# Patient Record
Sex: Male | Born: 1990 | Race: Black or African American | Hispanic: No | Marital: Single | Smoking: Current some day smoker
Health system: Southern US, Community
[De-identification: ages and names within clinical notes are randomized; demographics above are authoritative.]

## PROBLEM LIST (undated history)

## (undated) DIAGNOSIS — D649 Anemia, unspecified: Secondary | ICD-10-CM

## (undated) DIAGNOSIS — Z87898 Personal history of other specified conditions: Secondary | ICD-10-CM

## (undated) DIAGNOSIS — L0291 Cutaneous abscess, unspecified: Secondary | ICD-10-CM

---
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
## 2009-04-12 ENCOUNTER — Ambulatory Visit: Payer: Self-pay | Admitting: Family Medicine

## 2009-04-12 DIAGNOSIS — R040 Epistaxis: Secondary | ICD-10-CM

## 2009-04-13 ENCOUNTER — Encounter: Payer: Self-pay | Admitting: Family Medicine

## 2011-08-21 LAB — POCT I-STAT, CHEM 8
BUN: 13
Calcium, Ion: 1.15
Creatinine, Ser: 1
Glucose, Bld: 97
Hemoglobin: 14.3
Sodium: 140
TCO2: 24

## 2011-11-13 ENCOUNTER — Emergency Department (HOSPITAL_COMMUNITY)
Admission: EM | Admit: 2011-11-13 | Discharge: 2011-11-14 | Disposition: A | Discharge status: Home / Self Care | Payer: Self-pay | Attending: Emergency Medicine | Admitting: Emergency Medicine

## 2011-11-13 ENCOUNTER — Emergency Department (HOSPITAL_COMMUNITY): Payer: Self-pay

## 2011-11-13 ENCOUNTER — Encounter: Payer: Self-pay | Admitting: *Deleted

## 2011-11-13 ENCOUNTER — Other Ambulatory Visit: Payer: Self-pay

## 2011-11-13 DIAGNOSIS — F10929 Alcohol use, unspecified with intoxication, unspecified: Secondary | ICD-10-CM

## 2011-11-13 DIAGNOSIS — R040 Epistaxis: Secondary | ICD-10-CM | POA: Insufficient documentation

## 2011-11-13 DIAGNOSIS — R55 Syncope and collapse: Secondary | ICD-10-CM | POA: Insufficient documentation

## 2011-11-13 DIAGNOSIS — F1010 Alcohol abuse, uncomplicated: Secondary | ICD-10-CM | POA: Insufficient documentation

## 2011-11-13 HISTORY — DX: Personal history of other specified conditions: Z87.898

## 2011-11-13 LAB — POCT I-STAT, CHEM 8
Calcium, Ion: 1.15 mmol/L (ref 1.12–1.32)
Creatinine, Ser: 1.3 mg/dL (ref 0.50–1.35)
Glucose, Bld: 95 mg/dL (ref 70–99)
HCT: 46 % (ref 39.0–52.0)
Hemoglobin: 15.6 g/dL (ref 13.0–17.0)
TCO2: 28 mmol/L (ref 0–100)

## 2011-11-13 MED ORDER — SODIUM CHLORIDE 0.9 % IV BOLUS (SEPSIS)
2000.0000 mL | Freq: Once | INTRAVENOUS | Status: AC
  Administered 2011-11-13: 2000 mL via INTRAVENOUS

## 2011-11-13 NOTE — ED Notes (Signed)
Pt states he began feeling dizzy while he was working at [employer] and stated he went to the bathroom and he is not sure if he passed out or not.  PERRL. No active bleeding at this time. No obvious deformities to the head or any other region of the body.   Pt is irritable, but non-combative.  Mother at bedside.

## 2011-11-13 NOTE — ED Notes (Signed)
EMS was called to pt's work (he works at [employer]).  Staff called b/c pt was in bathroom a long time.  They were able to get into the bathroom and found him with a nose bleed.  Bleeding stopped before EMS arrived.  EMS states pt was combative and did not give any information.  Mother arrived at the scene and was able to calm pt.  Mother states pt has been binge drinking for the past few weeks and states hx of anger issues.  BP 170/90, hr 110.  No meds given.

## 2011-11-13 NOTE — ED Notes (Signed)
EKG GIVEN TO DR Gwendolyn Grant. NO OLD EKG

## 2011-11-13 NOTE — ED Provider Notes (Signed)
History     CSN: [id number]  Arrival date & time 11/13/11  2211   First MD Initiated Contact with Patient 11/13/11 2212      Chief Complaint  Patient presents with  . Epistaxis  . Alcohol Intoxication    (Consider location/radiation/quality/duration/timing/severity/associated sxs/prior treatment) HPI Comments: Epistaxis recurrent, happened today while he was at work.  Went to bathroom, states he sat down with nosebleed, unable to give more information. Probable syncope in bathroom.  Drank alcohol earlier today.  Patient is a 20 y.o. male presenting with nosebleeds. The history is provided by the patient. No language interpreter was used.  Epistaxis  This is a recurrent problem. The current episode started 1 to 2 hours ago. The problem occurs every several days. The problem has been resolved. The problem is associated with an unknown factor. The bleeding has been from both nares. He has tried nothing for the symptoms. The treatment provided no relief. His past medical history is significant for frequent nosebleeds.    Past Medical History  Diagnosis Date  . History of epistaxis     years ago    No past surgical history on file.  No family history on file.  History  Substance Use Topics  . Smoking status: Not on file  . Smokeless tobacco: Not on file  . Alcohol Use:       Review of Systems  Constitutional: Negative for fever and chills.  HENT: Positive for nosebleeds.   Cardiovascular: Negative for leg swelling.  Gastrointestinal: Negative for nausea and vomiting.  All other systems reviewed and are negative.    Allergies  Review of patient's allergies indicates no known allergies.  Home Medications  No current outpatient prescriptions on file.  BP 129/80  Pulse 86  Resp 15  SpO2 100%  Physical Exam  Constitutional: He is oriented to person, place, and time. He appears well-developed and well-nourished. No distress.  HENT:  Head: Normocephalic and  atraumatic.  Mouth/Throat: No oropharyngeal exudate.  Eyes: EOM are normal. Pupils are equal, round, and reactive to light.  Neck: Normal range of motion. Neck supple.  Cardiovascular: Normal rate and regular rhythm.  Exam reveals no friction rub.   No murmur heard. Pulmonary/Chest: Effort normal and breath sounds normal. No respiratory distress. He has no wheezes. He has no rales.  Abdominal: He exhibits no distension. There is no tenderness. There is no rebound.  Musculoskeletal: Normal range of motion. He exhibits no edema.  Neurological: He is alert and oriented to person, place, and time.       Heavily intoxicated  Skin: He is not diaphoretic.    ED Course  Procedures (including critical care time)   Labs Reviewed  I-STAT, CHEM 8   No results found.   1. EPISTAXIS, RECURRENT   2. Syncope   3. Alcohol intoxication       Date: 11/13/2011  Rate: 80  Rhythm: normal sinus rhythm  QRS Axis: normal  Intervals: normal  ST/T Wave abnormalities: early repolarization  Conduction Disutrbances:none  Narrative Interpretation:  R-S-R' in V1/V2  Old EKG Reviewed: none available   I-STAT, chem 8 (Final result)  Abnormal  Component (Lab Inquiry)      Result Time  NA  K  CL  BUN  Creatinine, Ser    11/13/11 2308  142  3.8  102  3 (L)  1.30           Result Time  GLUCOSE  CA ION  TCO2  HGB  HCT    11/13/11 2308  95  1.15  28  15.6  46.0          Imaging Results         CT Head Wo Contrast (Final result)   Result time:11/13/11 2319    Final result by Rad Results In Interface (11/13/11 23:19:03)    Narrative:   *RADIOLOGY REPORT*  Clinical Data: Epistaxis. Altered mental status.  CT HEAD WITHOUT CONTRAST  Technique: Contiguous axial images were obtained from the base of the skull through the vertex without contrast.  Comparison: None.  Findings: There is no evidence of acute infarction, mass lesion, or intra- or extra-axial hemorrhage on CT.  The posterior  fossa, including the cerebellum, brainstem and fourth ventricle, is within normal limits. The third and lateral ventricles, and basal ganglia are unremarkable in appearance. The cerebral hemispheres are symmetric in appearance, with normal gray- white differentiation. No mass effect or midline shift is seen.  There is no evidence of fracture; visualized osseous structures are unremarkable in appearance. The visualized portions of the orbits are within normal limits. The paranasal sinuses and mastoid air cells are well-aerated. No significant soft tissue abnormalities are seen.  IMPRESSION: Unremarkable noncontrast CT of the head.  Original Report Authenticated By: Tonia Ghent, M.D.    MDM  76M intoxicated, presenting with nosebleed. Found on flor of bathroom at [employer] where he works. Hx of recurrent nosebleeds. Intoxicated on arrival, unable to tell us full details about what happened. States he sat down in the bathroom. States he drank earlier in the day, obvious intoxication in ED.  Epistaxis resolved, blood in bilateral nares, no septal hematoma in nose. No tenderness/deformities of face. No extremity deformities. Will give fluids, check head CT since probable syncope. Will check baseline labs for glucose and Hgb values. Head CT, reviewed by me, read as negative. Normal blood sugar, hemoglobin. EKG as above. Stable for discharge, instruct to f/u with PCP in 3-4 days.         Elwin Mocha, MD 11/13/11 [phone]

## 2011-11-13 NOTE — ED Notes (Signed)
2000 ml bolus infusing.  Pt states he feels better and is able to answer all loc questions.  Vs stable.  No epistaxis noted.

## 2011-11-14 NOTE — ED Provider Notes (Signed)
The patient currently has clear speech and is oriented to person and place as well as time with pupils reactive extraocular movements intact peripheral visual fields full to confrontation no facial asymmetry normal light touch bilaterally out of 5 strength without pronator drift bilaterally normal finger to nose testing bilaterally, the patient has a ride home.  I saw and evaluated the patient, reviewed the resident's note and I agree with the findings and plan  including the ECG interpretation.   Hurman Horn, MD 11/14/11 0200

## 2012-10-18 ENCOUNTER — Emergency Department (HOSPITAL_COMMUNITY)
Admission: EM | Admit: 2012-10-18 | Discharge: 2012-10-18 | Disposition: A | Discharge status: Home / Self Care | Payer: Self-pay | Attending: Emergency Medicine | Admitting: Emergency Medicine

## 2012-10-18 ENCOUNTER — Encounter (HOSPITAL_COMMUNITY): Payer: Self-pay | Admitting: Emergency Medicine

## 2012-10-18 DIAGNOSIS — K529 Noninfective gastroenteritis and colitis, unspecified: Secondary | ICD-10-CM

## 2012-10-18 DIAGNOSIS — K520 Gastroenteritis and colitis due to radiation: Secondary | ICD-10-CM | POA: Insufficient documentation

## 2012-10-18 DIAGNOSIS — R197 Diarrhea, unspecified: Secondary | ICD-10-CM | POA: Insufficient documentation

## 2012-10-18 LAB — CBC WITH DIFFERENTIAL/PLATELET
Basophils Absolute: 0 10*3/uL (ref 0.0–0.1)
Basophils Relative: 0 % (ref 0–1)
Eosinophils Absolute: 0.1 10*3/uL (ref 0.0–0.7)
Hemoglobin: 12.6 g/dL — ABNORMAL LOW (ref 13.0–17.0)
MCH: 24.7 pg — ABNORMAL LOW (ref 26.0–34.0)
MCHC: 32.6 g/dL (ref 30.0–36.0)
Monocytes Relative: 10 % (ref 3–12)
Neutro Abs: 8.3 10*3/uL — ABNORMAL HIGH (ref 1.7–7.7)
Neutrophils Relative %: 79 % — ABNORMAL HIGH (ref 43–77)
Platelets: 363 10*3/uL (ref 150–400)
RDW: 13.8 % (ref 11.5–15.5)

## 2012-10-18 LAB — BASIC METABOLIC PANEL
Calcium: 9.2 mg/dL (ref 8.4–10.5)
Creatinine, Ser: 0.94 mg/dL (ref 0.50–1.35)
Potassium: 3.4 mEq/L — ABNORMAL LOW (ref 3.5–5.1)

## 2012-10-18 MED ORDER — ONDANSETRON HCL 4 MG/2ML IJ SOLN
4.0000 mg | Freq: Once | INTRAMUSCULAR | Status: AC
  Administered 2012-10-18: 4 mg via INTRAVENOUS
  Filled 2012-10-18: qty 2

## 2012-10-18 MED ORDER — SODIUM CHLORIDE 0.9 % IV BOLUS (SEPSIS)
2000.0000 mL | Freq: Once | INTRAVENOUS | Status: AC
  Administered 2012-10-18: 2000 mL via INTRAVENOUS

## 2012-10-18 MED ORDER — ONDANSETRON HCL 8 MG PO TABS: 8.0000 mg | ORAL_TABLET | ORAL | Status: DC | PRN

## 2012-10-18 NOTE — ED Notes (Signed)
NAD noted at time of d/c home with friend 

## 2012-10-18 NOTE — ED Notes (Signed)
Pt c/o N/V/D x 2 days with generalized body aches

## 2012-10-18 NOTE — ED Provider Notes (Signed)
History     CSN: [id number]  Arrival date & time 10/18/12  1322   First MD Initiated Contact with Patient 10/18/12 1409      Chief Complaint  Patient presents with  . Emesis  . Diarrhea    (Consider location/radiation/quality/duration/timing/severity/associated sxs/prior treatment) HPI The patient presents to the ED with a 1 day history of vomiting and diarrhea.  He reports nausea and vomiting since 7PM last night.  He reports 20 episode of vomiting since yesterday and 10 episodes of diarrhea last night.   He has not had diarrhea today and reports last emesis was 2 hours ago.  Denies fever reports chills.  Denies blood in stool.  His significant other reports similar symptoms earlier this week. The patient states that he ate cake and drank gatorade prior to arrival.   Past Medical History  Diagnosis Date  . History of epistaxis     years ago    History reviewed. No pertinent past surgical history.  History reviewed. No pertinent family history.  History  Substance Use Topics  . Smoking status: Never Smoker   . Smokeless tobacco: Not on file  . Alcohol Use: Yes     Comment: occ      Review of Systems All other systems negative except as documented in the HPI. All pertinent positives and negatives as reviewed in the HPI.   Allergies  Review of patient's allergies indicates no known allergies.  Home Medications  No current outpatient prescriptions on file.  BP 130/72  Pulse 83  Temp 98 F (36.7 C) (Oral)  Resp 18  SpO2 97%  Physical Exam  Nursing note and vitals reviewed. Constitutional: He appears well-developed and well-nourished.  HENT:  Mouth/Throat: Oropharynx is clear and moist and mucous membranes are normal.  Neck: Neck supple.  Cardiovascular: Normal rate, regular rhythm, S1 normal, S2 normal and normal heart sounds.   No murmur heard.  No systolic murmur is present   No diastolic murmur is present  Pulmonary/Chest: Effort normal and breath  sounds normal. He has no decreased breath sounds. He has no wheezes. He has no rhonchi. He has no rales.  Abdominal: Soft. Normal appearance. There is generalized tenderness. There is no rebound and no guarding.  Neurological: He is alert.    ED Course  Procedures (including critical care time)  Labs Reviewed  CBC WITH DIFFERENTIAL - Abnormal; Notable for the following:    Hemoglobin 12.6 (*)     HCT 38.7 (*)     MCV 75.9 (*)     MCH 24.7 (*)     Neutrophils Relative 79 (*)     Neutro Abs 8.3 (*)     Lymphocytes Relative 10 (*)     Monocytes Absolute 1.1 (*)     All other components within normal limits  BASIC METABOLIC PANEL - Abnormal; Notable for the following:    Sodium 134 (*)     Potassium 3.4 (*)     All other components within normal limits  URINALYSIS, ROUTINE W REFLEX MICROSCOPIC   The patient is stable here thus far. Will give IV fluids and monitor.    MDM          Carlyle Dolly, PA-C 10/18/12 [phone]

## 2012-10-19 NOTE — ED Provider Notes (Signed)
Medical screening examination/treatment/procedure(s) were performed by non-physician practitioner and as supervising physician I was immediately available for consultation/collaboration.  Alizza Sacra, MD 10/19/12 0654 

## 2013-02-12 ENCOUNTER — Other Ambulatory Visit: Payer: Self-pay

## 2013-02-12 ENCOUNTER — Encounter (HOSPITAL_COMMUNITY): Payer: Self-pay | Admitting: Family Medicine

## 2013-02-12 ENCOUNTER — Emergency Department (HOSPITAL_COMMUNITY)
Admission: EM | Admit: 2013-02-12 | Discharge: 2013-02-12 | Disposition: A | Discharge status: Home / Self Care | Payer: Self-pay | Attending: Emergency Medicine | Admitting: Emergency Medicine

## 2013-02-12 ENCOUNTER — Emergency Department (HOSPITAL_COMMUNITY): Payer: Self-pay

## 2013-02-12 DIAGNOSIS — R404 Transient alteration of awareness: Secondary | ICD-10-CM | POA: Insufficient documentation

## 2013-02-12 DIAGNOSIS — Z8669 Personal history of other diseases of the nervous system and sense organs: Secondary | ICD-10-CM | POA: Insufficient documentation

## 2013-02-12 DIAGNOSIS — R51 Headache: Secondary | ICD-10-CM | POA: Insufficient documentation

## 2013-02-12 DIAGNOSIS — R259 Unspecified abnormal involuntary movements: Secondary | ICD-10-CM | POA: Insufficient documentation

## 2013-02-12 DIAGNOSIS — R55 Syncope and collapse: Secondary | ICD-10-CM | POA: Insufficient documentation

## 2013-02-12 DIAGNOSIS — F29 Unspecified psychosis not due to a substance or known physiological condition: Secondary | ICD-10-CM | POA: Insufficient documentation

## 2013-02-12 LAB — CBC WITH DIFFERENTIAL/PLATELET
Basophils Relative: 0 % (ref 0–1)
Eosinophils Relative: 0 % (ref 0–5)
HCT: 41.2 % (ref 39.0–52.0)
Lymphs Abs: 1 10*3/uL (ref 0.7–4.0)
MCH: 24.9 pg — ABNORMAL LOW (ref 26.0–34.0)
MCV: 72.2 fL — ABNORMAL LOW (ref 78.0–100.0)
Monocytes Absolute: 0.8 10*3/uL (ref 0.1–1.0)
Neutro Abs: 11.9 10*3/uL — ABNORMAL HIGH (ref 1.7–7.7)
Platelets: 404 10*3/uL — ABNORMAL HIGH (ref 150–400)
RBC: 5.71 MIL/uL (ref 4.22–5.81)

## 2013-02-12 LAB — COMPREHENSIVE METABOLIC PANEL
BUN: 14 mg/dL (ref 6–23)
CO2: 24 mEq/L (ref 19–32)
Calcium: 10.1 mg/dL (ref 8.4–10.5)
Chloride: 94 mEq/L — ABNORMAL LOW (ref 96–112)
Creatinine, Ser: 0.9 mg/dL (ref 0.50–1.35)
GFR calc Af Amer: >90 mL/min (ref >90)
GFR calc non Af Amer: >90 mL/min (ref >90)
Glucose, Bld: 96 mg/dL (ref 70–99)
Total Bilirubin: 0.9 mg/dL (ref 0.3–1.2)

## 2013-02-12 LAB — RAPID URINE DRUG SCREEN, HOSP PERFORMED
Benzodiazepines: NOT DETECTED
Cocaine: NOT DETECTED
Opiates: NOT DETECTED

## 2013-02-12 MED ORDER — ACETAMINOPHEN 500 MG PO TABS
1000.0000 mg | ORAL_TABLET | Freq: Once | ORAL | Status: AC
  Administered 2013-02-12: 1000 mg via ORAL
  Filled 2013-02-12: qty 2

## 2013-02-12 MED ORDER — SODIUM CHLORIDE 0.9 % IV BOLUS (SEPSIS)
1000.0000 mL | Freq: Once | INTRAVENOUS | Status: AC
  Administered 2013-02-12: 1000 mL via INTRAVENOUS

## 2013-02-12 NOTE — ED Provider Notes (Signed)
History     CSN: [id number]  Arrival date & time 02/12/13  0932   First MD Initiated Contact with Patient 02/12/13 [phone]      Chief Complaint  Patient presents with  . Seizures    (Consider location/radiation/quality/duration/timing/severity/associated sxs/prior treatment) HPI Comments: Patient is a 22 year old male with no past medical history who presents after a witnessed seizure prior to arrival. Patient reports drinking milk this morning when he lost consciousness and started shaking. The patient's girlfriend was the witnessed who thinks he had a seizure. Patient has no known seizure disorder. The episode lasted "a few seconds" before spontaneously resolving. Patient does not remember anything and reports waking up in the ambulance. He denies any drug use. He reports some confusion after the episode and reports a current headache. He is unsure of any head trauma. No aggravating/alleviating factors. No other associated symptoms.    Past Medical History  Diagnosis Date  . History of epistaxis     years ago    History reviewed. No pertinent past surgical history.  History reviewed. No pertinent family history.  History  Substance Use Topics  . Smoking status: Never Smoker   . Smokeless tobacco: Not on file  . Alcohol Use: Yes     Comment: occ      Review of Systems  Neurological: Positive for seizures.  All other systems reviewed and are negative.    Allergies  Review of patient's allergies indicates no known allergies.  Home Medications  No current outpatient prescriptions on file.  BP 120/82  Pulse 112  Temp(Src) 99.4 F (37.4 C) (Oral)  Resp 14  SpO2 100%  Physical Exam  Nursing note and vitals reviewed. Constitutional: He is oriented to person, place, and time. He appears well-developed and well-nourished. No distress.  HENT:  Head: Normocephalic and atraumatic.  Mouth/Throat: Oropharynx is clear and moist. No oropharyngeal exudate.  Eyes:  Conjunctivae are normal. Pupils are equal, round, and reactive to light. No scleral icterus.  Neck: Normal range of motion. Neck supple.  Cardiovascular: Normal rate and regular rhythm.  Exam reveals no gallop and no friction rub.   No murmur heard. Pulmonary/Chest: Effort normal and breath sounds normal. He has no wheezes. He has no rales. He exhibits no tenderness.  Abdominal: Soft. He exhibits no distension. There is no tenderness. There is no rebound.  Musculoskeletal: Normal range of motion.  Neurological: He is alert and oriented to person, place, and time. No cranial nerve deficit. Coordination normal.  Strength and sensation equal and intact bilaterally. Speech is goal-oriented. Moves limbs without ataxia.   Skin: Skin is warm and dry.  Psychiatric: He has a normal mood and affect. His behavior is normal.    ED Course  Procedures (including critical care time)  Labs Reviewed  CBC WITH DIFFERENTIAL - Abnormal; Notable for the following:    WBC 13.7 (*)    MCV 72.2 (*)    MCH 24.9 (*)    Platelets 404 (*)    Neutrophils Relative 87 (*)    Lymphocytes Relative 7 (*)    Neutro Abs 11.9 (*)    All other components within normal limits  COMPREHENSIVE METABOLIC PANEL - Abnormal; Notable for the following:    Potassium 6.2 (*)    Chloride 94 (*)    Total Protein 8.8 (*)    AST 121 (*)    All other components within normal limits  URINE RAPID DRUG SCREEN (HOSP PERFORMED)  POTASSIUM   Ct Head  Wo Contrast  02/12/2013  *RADIOLOGY REPORT*  Clinical Data:  Seizure  CT HEAD WITHOUT CONTRAST  Technique:  Contiguous axial images were obtained from the base of the skull through the vertex without contrast  Comparison:  11/13/2011  Findings:  The brain has a normal appearance without evidence for hemorrhage, acute infarction, hydrocephalus, or mass lesion.  There is no extra axial fluid collection.  The skull and paranasal sinuses are normal.  IMPRESSION: Normal CT of the head without  contrast.   Original Report Authenticated By: Janeece Riggers, M.D.      1. Syncope       MDM  10:03 AM Labs and CT head pending. Patient will have tylenol for headache.   1:39 PM Initial potassium level was 6.2 and recheck shows 4.5. Patient feels better. Head CT unremarkable. UDS unremarkable. Patient will be discharged with recommended neurology follow up . Patient instructed to return with worsening or concerning symptoms. Vitals stable and patient afebrile. No DVT risk factors.      Emilia Beck, PA-C 02/12/13 1343

## 2013-02-12 NOTE — ED Notes (Signed)
Per EMS, pt friends witnessed a shaking episode and some confusion this am. Unknown if seizure. No hx of same. CBG 82. Tachy on the monitor 118. BP 122 sys. 18 LFA. A&Ox3. sts confused about what happened. sts feels febrile.

## 2013-02-12 NOTE — ED Notes (Signed)
Patient transported to CT 

## 2013-02-12 NOTE — ED Provider Notes (Signed)
Medical screening examination/treatment/procedure(s) were performed by non-physician practitioner and as supervising physician I was immediately available for consultation/collaboration.   Carleene Cooper III, MD 02/12/13 2139

## 2013-02-12 NOTE — ED Provider Notes (Signed)
12:10 PM  Date: 02/12/2013  Rate: 120  Rhythm: sinus tachycardia  QRS Axis: right  Intervals: normal' PQRS:  Left atrial abnormality, left ventricular hypertrophy  ST/T Wave abnormalities: early repolarization  Conduction Disutrbances:none  Narrative Interpretation: Abnormal EKG  Old EKG Reviewed: changes noted--Rate more rapid tan on tracing of 11/13/2011.    Carleene Cooper III, MD 02/12/13 [phone]

## 2013-02-12 NOTE — ED Notes (Signed)
Pt return from CT.

## 2013-02-12 NOTE — ED Notes (Signed)
Diet ordered for pt

## 2013-10-03 ENCOUNTER — Encounter (HOSPITAL_COMMUNITY): Payer: Self-pay | Admitting: Emergency Medicine

## 2013-10-03 ENCOUNTER — Emergency Department (HOSPITAL_COMMUNITY): Payer: Self-pay

## 2013-10-03 ENCOUNTER — Emergency Department (HOSPITAL_COMMUNITY)
Admission: EM | Admit: 2013-10-03 | Discharge: 2013-10-03 | Disposition: A | Discharge status: Home / Self Care | Payer: Self-pay | Attending: Emergency Medicine | Admitting: Emergency Medicine

## 2013-10-03 ENCOUNTER — Emergency Department (INDEPENDENT_AMBULATORY_CARE_PROVIDER_SITE_OTHER)
Admission: EM | Admit: 2013-10-03 | Discharge: 2013-10-03 | Disposition: A | Discharge status: Other Acute Inpatient Hospital | Payer: Self-pay | Source: Home / Self Care | Attending: Family Medicine | Admitting: Family Medicine

## 2013-10-03 DIAGNOSIS — F1010 Alcohol abuse, uncomplicated: Secondary | ICD-10-CM | POA: Insufficient documentation

## 2013-10-03 DIAGNOSIS — F10929 Alcohol use, unspecified with intoxication, unspecified: Secondary | ICD-10-CM

## 2013-10-03 DIAGNOSIS — R5381 Other malaise: Secondary | ICD-10-CM | POA: Insufficient documentation

## 2013-10-03 DIAGNOSIS — R4182 Altered mental status, unspecified: Secondary | ICD-10-CM

## 2013-10-03 DIAGNOSIS — Y929 Unspecified place or not applicable: Secondary | ICD-10-CM | POA: Insufficient documentation

## 2013-10-03 DIAGNOSIS — X58XXXA Exposure to other specified factors, initial encounter: Secondary | ICD-10-CM | POA: Insufficient documentation

## 2013-10-03 DIAGNOSIS — Z8709 Personal history of other diseases of the respiratory system: Secondary | ICD-10-CM | POA: Insufficient documentation

## 2013-10-03 DIAGNOSIS — Y939 Activity, unspecified: Secondary | ICD-10-CM | POA: Insufficient documentation

## 2013-10-03 DIAGNOSIS — IMO0002 Reserved for concepts with insufficient information to code with codable children: Secondary | ICD-10-CM | POA: Insufficient documentation

## 2013-10-03 DIAGNOSIS — R4789 Other speech disturbances: Secondary | ICD-10-CM | POA: Insufficient documentation

## 2013-10-03 LAB — COMPREHENSIVE METABOLIC PANEL
AST: 68 U/L — ABNORMAL HIGH (ref 0–37)
Alkaline Phosphatase: 52 U/L (ref 39–117)
BUN: 6 mg/dL (ref 6–23)
Chloride: 105 mEq/L (ref 96–112)
Creatinine, Ser: 0.81 mg/dL (ref 0.50–1.35)
GFR calc Af Amer: >90 mL/min (ref >90)
Glucose, Bld: 99 mg/dL (ref 70–99)
Potassium: 3.9 mEq/L (ref 3.5–5.1)
Total Bilirubin: 0.3 mg/dL (ref 0.3–1.2)
Total Protein: 6.6 g/dL (ref 6.0–8.3)

## 2013-10-03 LAB — CBC WITH DIFFERENTIAL/PLATELET
Basophils Absolute: 0 10*3/uL (ref 0.0–0.1)
Eosinophils Absolute: 0 10*3/uL (ref 0.0–0.7)
Eosinophils Relative: 0 % (ref 0–5)
HCT: 35.5 % — ABNORMAL LOW (ref 39.0–52.0)
Hemoglobin: 11.9 g/dL — ABNORMAL LOW (ref 13.0–17.0)
Lymphs Abs: 1.5 10*3/uL (ref 0.7–4.0)
MCH: 25.3 pg — ABNORMAL LOW (ref 26.0–34.0)
MCV: 75.5 fL — ABNORMAL LOW (ref 78.0–100.0)
Monocytes Absolute: 0.9 10*3/uL (ref 0.1–1.0)
Monocytes Relative: 7 % (ref 3–12)
Neutrophils Relative %: 83 % — ABNORMAL HIGH (ref 43–77)
Platelets: 328 10*3/uL (ref 150–400)
RBC: 4.7 MIL/uL (ref 4.22–5.81)

## 2013-10-03 LAB — ETHANOL: Alcohol, Ethyl (B): 411 mg/dL (ref 0–11)

## 2013-10-03 LAB — RAPID URINE DRUG SCREEN, HOSP PERFORMED
Barbiturates: NOT DETECTED
Cocaine: NOT DETECTED

## 2013-10-03 MED ORDER — SODIUM CHLORIDE 0.9 % IV BOLUS (SEPSIS)
1000.0000 mL | Freq: Once | INTRAVENOUS | Status: AC
  Administered 2013-10-03: 1000 mL via INTRAVENOUS

## 2013-10-03 MED ORDER — NALOXONE HCL 0.4 MG/ML IJ SOLN
INTRAMUSCULAR | Status: AC
  Filled 2013-10-03: qty 1

## 2013-10-03 MED ORDER — NALOXONE HCL 0.4 MG/ML IJ SOLN
0.4000 mg | INTRAMUSCULAR | Status: DC | PRN
  Administered 2013-10-03 (×2): 0.4 mg via INTRAVENOUS

## 2013-10-03 MED ORDER — NALOXONE HCL 0.4 MG/ML IJ SOLN: 0.4000 mg | INTRAMUSCULAR | Status: DC | PRN

## 2013-10-03 NOTE — ED Notes (Signed)
Dr. Plunkett at bedside.  

## 2013-10-03 NOTE — ED Notes (Signed)
[facility] called. 

## 2013-10-03 NOTE — ED Provider Notes (Addendum)
CSN: [id number]     Arrival date & time 10/03/13  1058 History   First MD Initiated Contact with Patient 10/03/13 1107     Chief Complaint  Patient presents with  . Altered Mental Status   (Consider location/radiation/quality/duration/timing/severity/associated sxs/prior Treatment) Patient is a 22 y.o. male presenting with altered mental status. The history is provided by the patient.  Altered Mental Status Presenting symptoms: partial responsiveness   Presenting symptoms: no confusion   Severity:  Moderate Most recent episode:  Today Episode history:  Continuous Duration:  2 hours Timing:  Constant Progression:  Unchanged Chronicity:  New Context: alcohol use   Context: not drug use, not head injury, not a recent change in medication, not a recent illness and not a recent infection   Associated symptoms: slurred speech   Associated symptoms: no abdominal pain, no bladder incontinence, no decreased appetite, no depression, no difficulty breathing, no fever, no headaches, no light-headedness, no vomiting and no weakness     Past Medical History  Diagnosis Date  . History of epistaxis     years ago   History reviewed. No pertinent past surgical history. History reviewed. No pertinent family history. History  Substance Use Topics  . Smoking status: Never Smoker   . Smokeless tobacco: Never Used  . Alcohol Use: 1.8 oz/week    3 Cans of beer per week     Comment: ETOH on board today    Review of Systems  Constitutional: Negative for fever and decreased appetite.  Gastrointestinal: Negative for vomiting and abdominal pain.  Genitourinary: Negative for bladder incontinence.  Neurological: Negative for weakness, light-headedness and headaches.  Psychiatric/Behavioral: Negative for confusion.  All other systems reviewed and are negative.    Allergies  Review of patient's allergies indicates no known allergies.  Home Medications  No current outpatient prescriptions on  file. BP 128/75  Pulse 71  Temp(Src) 97.9 F (36.6 C) (Oral)  Resp 22  SpO2 99% Physical Exam  Nursing note and vitals reviewed. Constitutional: He is oriented to person, place, and time. He appears well-developed and well-nourished. He is sleeping. He is easily aroused. No distress.  Will wake up easily to voice and answer questions appropriately  HENT:  Head: Normocephalic and atraumatic.  Mouth/Throat: Oropharynx is clear and moist.  Eyes: Conjunctivae and EOM are normal. Pupils are equal, round, and reactive to light.  Neck: Normal range of motion. Neck supple.  Cardiovascular: Normal rate, regular rhythm and intact distal pulses.   No murmur heard. Pulmonary/Chest: Effort normal and breath sounds normal. No respiratory distress. He has no wheezes. He has no rales.  Abdominal: Soft. He exhibits no distension. There is no tenderness. There is no rebound and no guarding.  Musculoskeletal: Normal range of motion. He exhibits no edema and no tenderness.  Small abrasion to the right knee  Neurological: He is oriented to person, place, and time and easily aroused.  Skin: Skin is warm and dry. No rash noted. No erythema.  Psychiatric: He has a normal mood and affect. His behavior is normal.    ED Course  Procedures (including critical care time) Labs Review Labs Reviewed  CBC WITH DIFFERENTIAL - Abnormal; Notable for the following:    WBC 14.4 (*)    Hemoglobin 11.9 (*)    HCT 35.5 (*)    MCV 75.5 (*)    MCH 25.3 (*)    Neutrophils Relative % 83 (*)    Neutro Abs 11.9 (*)    Lymphocytes Relative 11 (*)  All other components within normal limits  COMPREHENSIVE METABOLIC PANEL - Abnormal; Notable for the following:    Calcium 7.9 (*)    Albumin 3.3 (*)    AST 68 (*)    ALT 59 (*)    All other components within normal limits  ETHANOL - Abnormal; Notable for the following:    Alcohol, Ethyl (B) 411 (*)    All other components within normal limits  URINE RAPID DRUG SCREEN  (HOSP PERFORMED)   Imaging Review Ct Head Wo Contrast  10/03/2013   CLINICAL DATA:  Lethargy.  EXAM: CT HEAD WITHOUT CONTRAST  TECHNIQUE: Contiguous axial images were obtained from the base of the skull through the vertex without intravenous contrast.  COMPARISON:  Head CT scan 02/12/2013.  FINDINGS: The brain appears normal without infarct, hemorrhage, mass lesion, mass effect, midline shift or abnormal extra-axial fluid collection. There is no hydrocephalus or tonsillar. The calvarium is intact. Imaged paranasal sinuses and mastoid air cells are clear.  IMPRESSION: Negative head CT.   Electronically Signed   By: Drusilla Kanner M.D.   On: 10/03/2013 13:06    EKG Interpretation     Ventricular Rate:  72 PR Interval:  124 QRS Duration: 96 QT Interval:  398 QTC Calculation: 435 R Axis:   92 Text Interpretation:  Sinus rhythm Consider right ventricular hypertrophy ST elev, probable normal early repol pattern No significant change since last tracing            MDM   1. Alcohol intoxication     Patient presented from urgent care due to altered mental status for the last 2 hours. Patient is a rare but drowsy. Family member with him states that he drinks 3 beers this morning but had no other substances that he is aware of. Patient denies drug use. Patient can answer questions appropriately and states he just wants to sleep and he will otherwise be fine. He has an unremarkable EKG and no exam findings except for drowsiness and occasional rhythmic of the eyes.  Is a reactive bilaterally. At urgent care patient was given Narcan with no response. Family member denies any head injury and patient is neurovascularly intact. Feel most likely this is substance related. CBC, CMP, alcohol, UDS, head CT pending  3:58 PM Pt is now sober and will be d/ced with family.  Gwyneth Sprout, MD 10/03/13 1559  Gwyneth Sprout, MD 10/03/13 1610

## 2013-10-03 NOTE — ED Notes (Addendum)
Pt transferred from [facility] by [facility]. Pt was found to be lethargic by his Grandfather approx 1 hour ago after drinking 3 beers with his grandfather. When asked without grandfather at bedside, pt denies any illicit drug use.  Pt was given Narcan by [facility], per [facility], this made the pt slightly more alert. PT is oriented x4, arousable to speech. Pt received NS PTA.

## 2013-10-03 NOTE — ED Provider Notes (Addendum)
CSN: [id number]     Arrival date & time 10/03/13  1011 History   First MD Initiated Contact with Patient 10/03/13 1027     No chief complaint on file.  (Consider location/radiation/quality/duration/timing/severity/associated sxs/prior Treatment) Patient is a 22 y.o. male presenting with neurologic complaint. The history is provided by the patient.  Neurologic Problem This is a new problem. The current episode started 1 to 2 hours ago (according to grandfather suddenly "don't feel well", no other complaints.). The problem occurs constantly. The problem has been gradually worsening. Pertinent negatives include no chest pain, no abdominal pain and no headaches.    Past Medical History  Diagnosis Date  . History of epistaxis     years ago   History reviewed. No pertinent past surgical history. History reviewed. No pertinent family history. History  Substance Use Topics  . Smoking status: Never Smoker   . Smokeless tobacco: Not on file  . Alcohol Use: Yes     Comment: occ    Review of Systems  Constitutional: Negative.   HENT: Negative.   Respiratory: Negative.   Cardiovascular: Negative for chest pain.  Gastrointestinal: Negative.  Negative for abdominal pain.  Neurological: Negative for headaches.    Allergies  Review of patient's allergies indicates no known allergies.  Home Medications  No current outpatient prescriptions on file. BP 138/87  Pulse 86  Temp(Src) 98.2 F (36.8 C) (Oral)  Resp 12  SpO2 98% Physical Exam  Nursing note and vitals reviewed. Constitutional: He appears well-developed and well-nourished. He appears lethargic. He appears distressed.  HENT:  Head: Normocephalic and atraumatic.  Eyes: Pupils are equal, round, and reactive to light.  Disconjugate eye movements,not prev known by family.  Neck: Normal range of motion. Neck supple.  Cardiovascular: Normal rate, regular rhythm, normal heart sounds and intact distal pulses.   Pulmonary/Chest:  Effort normal and breath sounds normal.  Abdominal: Soft. Bowel sounds are normal.  Lymphadenopathy:    He has no cervical adenopathy.  Neurological: He has normal strength. He appears lethargic. He is disoriented.  Nl gross motor fxn of ext.  Skin: Skin is warm and dry.    ED Course  Procedures (including critical care time) Labs Review Labs Reviewed  GLUCOSE, CAPILLARY   Imaging Review No results found.  EKG Interpretation     Ventricular Rate:    PR Interval:    QRS Duration:   QT Interval:    QTC Calculation:   R Axis:     Text Interpretation:              MDM   1. Altered mental status   sent for further eval of acute change in mental state, had 3 beers this am, denies drug use, no pain ,no fever. [id number] , 1amp narcan given x 2. Possible improved alertness.   Linna Hoff, MD 10/03/13 1034  Linna Hoff, MD 10/03/13 1036  Linna Hoff, MD 10/03/13 1040  Linna Hoff, MD 10/03/13 [phone]

## 2013-10-03 NOTE — ED Notes (Signed)
Pt comfortable with d/c and f/u instructions. Ambulatory in room and out to waiting room with family members. A&Ox4, in NAD, requesting to leave as soon as possible. No prescriptions.

## 2013-10-03 NOTE — ED Notes (Signed)
Grandfather brought in , aint making no sense; patient admits to 3 small beers; chart shows seizure history

## 2013-10-03 NOTE — ED Notes (Addendum)
Received ETOH 411. Dr. Anitra Lauth aware.

## 2013-10-03 NOTE — ED Notes (Signed)
Brought pt back from CT. Pt in NAD

## 2013-10-03 NOTE — ED Notes (Signed)
Pt alert, asking to go home, friends at bedside at this time. Pt oriented x4. Dr. Anitra Lauth aware

## 2014-01-14 ENCOUNTER — Encounter (HOSPITAL_COMMUNITY): Payer: Self-pay | Admitting: Emergency Medicine

## 2014-01-14 ENCOUNTER — Emergency Department (HOSPITAL_COMMUNITY)
Admission: EM | Admit: 2014-01-14 | Discharge: 2014-01-14 | Disposition: A | Discharge status: Home / Self Care | Payer: Self-pay | Attending: Emergency Medicine | Admitting: Emergency Medicine

## 2014-01-14 DIAGNOSIS — Z008 Encounter for other general examination: Secondary | ICD-10-CM | POA: Insufficient documentation

## 2014-01-14 DIAGNOSIS — Z8709 Personal history of other diseases of the respiratory system: Secondary | ICD-10-CM | POA: Insufficient documentation

## 2014-01-14 DIAGNOSIS — Z711 Person with feared health complaint in whom no diagnosis is made: Secondary | ICD-10-CM | POA: Insufficient documentation

## 2014-01-14 NOTE — ED Notes (Signed)
Gave plasma 2 days ago and was told his pulse and his bp was high  Needs dr note saying he can give  Again is having NO s/s he staes

## 2014-01-14 NOTE — ED Notes (Signed)
Per Otila KluverLawyer, PA, social work to come see patient about getting primary care provider.

## 2014-01-14 NOTE — ED Provider Notes (Signed)
Medical screening examination/treatment/procedure(s) were performed by non-physician practitioner and as supervising physician I was immediately available for consultation/collaboration.  EKG Interpretation   None         Kristen N Ward, DO 01/14/14 1522 

## 2014-01-14 NOTE — ED Provider Notes (Signed)
CSN: [id number]     Arrival date & time 01/14/14  1252 History   First MD Initiated Contact with Patient 01/14/14 1321     Chief Complaint  Patient presents with  . Medication Refill     (Consider location/radiation/quality/duration/timing/severity/associated sxs/prior Treatment) HPI Patient presents emergency department for a note stating that he conditioning, plasma.  Patient, states he's been attempting to donate plasma, but they stated his blood pressure is high, and he'll need to see Dr. for a release to donate plasma.  Patient, states, that he has not had any chest pain, shortness breath, nausea, vomiting, headache, blurred vision, weakness, dizziness, or syncope.  The patient, states, that his blood pressure is usually normal.  He states the last 2 times he didn't take donate plasma has been very upset Past Medical History  Diagnosis Date  . History of epistaxis     years ago   History reviewed. No pertinent past surgical history. No family history on file. History  Substance Use Topics  . Smoking status: Never Smoker   . Smokeless tobacco: Never Used  . Alcohol Use: 1.8 oz/week    3 Cans of beer per week     Comment: ETOH on board today    Review of Systems   All other systems negative except as documented in the HPI. All pertinent positives and negatives as reviewed in the HPI.  Allergies  Review of patient's allergies indicates no known allergies.  Home Medications  No current outpatient prescriptions on file. BP 155/82  Pulse 105  Temp(Src) 97.4 F (36.3 C) (Oral)  Resp 18  Wt 150 lb (68.04 kg)  SpO2 98% Physical Exam  Nursing note and vitals reviewed. Constitutional: He is oriented to person, place, and time. He appears well-developed and well-nourished.  HENT:  Head: Normocephalic and atraumatic.  Neck: Normal range of motion. Neck supple.  Pulmonary/Chest: Effort normal.  Neurological: He is alert and oriented to person, place, and time.  Skin: Skin  is warm and dry.    ED Course  Procedures (including critical care time) Case manager cancer with patient and helped with outpatient resources for followup.  I do not feel I can write the patient a note to return to plasma donation as he is not a regular Patient as I work in the emergency department.    Carlyle DollyChristopher W Aquinnah Devin, PA-C 01/14/14 1419

## 2014-01-14 NOTE — Discharge Planning (Signed)
[facility] Felicia E, Community Liaison [employer] Spoke to patient about following up with a  pcp for further evaluation of blood pressure issues. Explained to patient that consent papers from the plasma center would be handled by a pcp. Patient was given a Facilities managerresource guide and my contact information for any questions or concerns.

## 2014-01-14 NOTE — Discharge Instructions (Signed)
Return here as needed.  Follow-up with the resources provided. °

## 2014-01-14 NOTE — ED Notes (Signed)
Patient has no complaints.  Patient states he just needs a note so he can donate plasma.

## 2014-01-24 ENCOUNTER — Emergency Department (INDEPENDENT_AMBULATORY_CARE_PROVIDER_SITE_OTHER)
Admission: EM | Admit: 2014-01-24 | Discharge: 2014-01-24 | Disposition: A | Discharge status: Home / Self Care | Payer: Self-pay | Source: Home / Self Care | Attending: Family Medicine | Admitting: Family Medicine

## 2014-01-24 ENCOUNTER — Encounter (HOSPITAL_COMMUNITY): Payer: Self-pay | Admitting: Emergency Medicine

## 2014-01-24 DIAGNOSIS — Z0000 Encounter for general adult medical examination without abnormal findings: Secondary | ICD-10-CM

## 2014-01-24 NOTE — Discharge Instructions (Signed)
See your doctor for further blood pressure monitoring

## 2014-01-24 NOTE — ED Notes (Signed)
Patient states he donates plasma twice a week Recently was told his blood pressure was too high and  Needed it to be checked

## 2014-01-24 NOTE — ED Provider Notes (Signed)
CSN: [id number]     Arrival date & time 01/24/14  1248 History   First MD Initiated Contact with Patient 01/24/14 1438     Chief Complaint  Patient presents with  . Hypertension   (Consider location/radiation/quality/duration/timing/severity/associated sxs/prior Treatment) Patient is a 23 y.o. male presenting with hypertension. The history is provided by the patient.  Hypertension This is a new problem. The current episode started more than 1 week ago (told bp elevated at plasma center recently.). The problem has not changed since onset.Pertinent negatives include no chest pain, no abdominal pain and no headaches.    Past Medical History  Diagnosis Date  . History of epistaxis     years ago   History reviewed. No pertinent past surgical history. No family history on file. History  Substance Use Topics  . Smoking status: Never Smoker   . Smokeless tobacco: Never Used  . Alcohol Use: 1.8 oz/week    3 Cans of beer per week     Comment: ETOH on board today    Review of Systems  Constitutional: Negative.   Respiratory: Negative.   Cardiovascular: Negative.  Negative for chest pain.  Gastrointestinal: Negative.  Negative for abdominal pain.  Neurological: Negative for dizziness and headaches.    Allergies  Review of patient's allergies indicates no known allergies.  Home Medications  No current outpatient prescriptions on file. BP 168/95  Pulse 81  Temp(Src) 97.5 F (36.4 C) (Oral)  Resp 18  SpO2 98% Physical Exam  Nursing note and vitals reviewed. Constitutional: He is oriented to person, place, and time. He appears well-developed and well-nourished.  Cardiovascular: Regular rhythm and normal heart sounds.   Pulmonary/Chest: Effort normal and breath sounds normal.  Musculoskeletal: He exhibits no edema.  Neurological: He is alert and oriented to person, place, and time.  Skin: Skin is warm and dry.    ED Course  Procedures (including critical care time) Labs  Review Labs Reviewed - No data to display Imaging Review No results found.   MDM   1. Visit for well man health check        Linna HoffJames D Oluwaseyi Raffel, MD 01/24/14 [phone]

## 2014-02-04 ENCOUNTER — Ambulatory Visit: Payer: Self-pay

## 2014-02-13 ENCOUNTER — Ambulatory Visit: Payer: Self-pay

## 2014-02-20 ENCOUNTER — Ambulatory Visit: Payer: Self-pay | Attending: Internal Medicine | Admitting: Internal Medicine

## 2014-02-20 ENCOUNTER — Encounter: Payer: Self-pay | Admitting: Internal Medicine

## 2014-02-20 VITALS — BP 160/110 | HR 80 | Temp 97.6°F | Resp 18 | Ht 68.0 in | Wt 143.8 lb

## 2014-02-20 DIAGNOSIS — I10 Essential (primary) hypertension: Secondary | ICD-10-CM | POA: Insufficient documentation

## 2014-02-20 DIAGNOSIS — Z23 Encounter for immunization: Secondary | ICD-10-CM | POA: Insufficient documentation

## 2014-02-20 DIAGNOSIS — I159 Secondary hypertension, unspecified: Secondary | ICD-10-CM

## 2014-02-20 DIAGNOSIS — Z008 Encounter for other general examination: Secondary | ICD-10-CM | POA: Insufficient documentation

## 2014-02-20 LAB — COMPLETE METABOLIC PANEL WITH GFR
ALT: 15 U/L (ref 0–53)
AST: 22 U/L (ref 0–37)
Albumin: 4.6 g/dL (ref 3.5–5.2)
Alkaline Phosphatase: 44 U/L (ref 39–117)
BUN: 7 mg/dL (ref 6–23)
CALCIUM: 9.6 mg/dL (ref 8.4–10.5)
CO2: 21 mEq/L (ref 19–32)
CREATININE: 0.86 mg/dL (ref 0.50–1.35)
Chloride: 99 mEq/L (ref 96–112)
GFR, Est African American: >89 mL/min
GFR, Est Non African American: >89 mL/min
Glucose, Bld: 91 mg/dL (ref 70–99)
POTASSIUM: 3.9 meq/L (ref 3.5–5.3)
Sodium: 130 mEq/L — ABNORMAL LOW (ref 135–145)
Total Bilirubin: 0.7 mg/dL (ref 0.2–1.2)
Total Protein: 7.7 g/dL (ref 6.0–8.3)

## 2014-02-20 LAB — LIPID PANEL
Cholesterol: 131 mg/dL (ref 0–200)
HDL: 66 mg/dL (ref >39)
LDL CALC: 49 mg/dL (ref 0–99)
Total CHOL/HDL Ratio: 2 Ratio
Triglycerides: 81 mg/dL (ref <150)
VLDL: 16 mg/dL (ref 0–40)

## 2014-02-20 LAB — CBC WITH DIFFERENTIAL/PLATELET
BASOS PCT: 0 % (ref 0–1)
Basophils Absolute: 0 10*3/uL (ref 0.0–0.1)
EOS ABS: 0.1 10*3/uL (ref 0.0–0.7)
EOS PCT: 1 % (ref 0–5)
HCT: 35.9 % — ABNORMAL LOW (ref 39.0–52.0)
Hemoglobin: 12 g/dL — ABNORMAL LOW (ref 13.0–17.0)
LYMPHS ABS: 1.4 10*3/uL (ref 0.7–4.0)
Lymphocytes Relative: 20 % (ref 12–46)
MCH: 24.6 pg — ABNORMAL LOW (ref 26.0–34.0)
MCHC: 33.4 g/dL (ref 30.0–36.0)
MCV: 73.7 fL — AB (ref 78.0–100.0)
MONOS PCT: 7 % (ref 3–12)
Monocytes Absolute: 0.5 10*3/uL (ref 0.1–1.0)
NEUTROS PCT: 72 % (ref 43–77)
Neutro Abs: 5.1 10*3/uL (ref 1.7–7.7)
PLATELETS: 382 10*3/uL (ref 150–400)
RBC: 4.87 MIL/uL (ref 4.22–5.81)
RDW: 14.9 % (ref 11.5–15.5)
WBC: 7.1 10*3/uL (ref 4.0–10.5)

## 2014-02-20 LAB — TSH: TSH: 0.632 u[IU]/mL (ref 0.350–4.500)

## 2014-02-20 LAB — CORTISOL: Cortisol, Plasma: 11.4 ug/dL

## 2014-02-20 MED ORDER — HYDROCHLOROTHIAZIDE 25 MG PO TABS: 25.0000 mg | ORAL_TABLET | Freq: Every day | ORAL | Status: DC

## 2014-02-20 MED ORDER — HYDROCHLOROTHIAZIDE 12.5 MG PO CAPS: 12.5000 mg | ORAL_CAPSULE | Freq: Every day | ORAL | Status: DC

## 2014-02-20 NOTE — Progress Notes (Signed)
Patient ID: Jose Hernandez, male   DOB: 1991-02-24, 23 y.o.   MRN: [id number]   CC:  HPI: 23 year old male here to establish care. The patient was told recently when he went to donate plasma that his blood pressure is high on 3 different occasions. He was seen at urgent care on 3/7, and his blood pressure was in the 160s. Denies any chest pain or shortness of breath, dizziness or near syncopal episode does complain of occasional headaches. Denies any recreational drug use  family history negative for hypertension, He states that he is fairly active and exercises quite a bit, that includes running.  The patient used to be a heavy drinker as of last but he has slowed down. He is a nonsmoker.   No Known Allergies Past Medical History  Diagnosis Date  . History of epistaxis     years ago   No current outpatient prescriptions on file prior to visit.   No current facility-administered medications on file prior to visit.   No family history on file. History   Social History  . Marital Status: Single    Spouse Name: N/A    Number of Children: N/A  . Years of Education: N/A   Occupational History  . Not on file.   Social History Main Topics  . Smoking status: Never Smoker   . Smokeless tobacco: Never Used  . Alcohol Use: 1.8 oz/week    3 Cans of beer per week     Comment: ETOH on board today  . Drug Use: Not on file  . Sexual Activity: Not on file   Other Topics Concern  . Not on file   Social History Narrative  . No narrative on file    Review of Systems  Constitutional: Negative for fever, chills, diaphoresis, activity change, appetite change and fatigue.  HENT: Negative for ear pain, nosebleeds, congestion, facial swelling, rhinorrhea, neck pain, neck stiffness and ear discharge.   Eyes: Negative for pain, discharge, redness, itching and visual disturbance.  Respiratory: Negative for cough, choking, chest tightness, shortness of breath, wheezing and stridor.    Cardiovascular: Negative for chest pain, palpitations and leg swelling.  Gastrointestinal: Negative for abdominal distention.  Genitourinary: Negative for dysuria, urgency, frequency, hematuria, flank pain, decreased urine volume, difficulty urinating and dyspareunia.  Musculoskeletal: Negative for back pain, joint swelling, arthralgias and gait problem.  Neurological: Negative for dizziness, tremors, seizures, syncope, facial asymmetry, speech difficulty, weakness, light-headedness, numbness and headaches.  Hematological: Negative for adenopathy. Does not bruise/bleed easily.  Psychiatric/Behavioral: Negative for hallucinations, behavioral problems, confusion, dysphoric mood, decreased concentration and agitation.    Objective:   Filed Vitals:   02/20/14 1210  BP: 160/110  Pulse:   Temp:   Resp:     Physical Exam  Constitutional: Appears well-developed and well-nourished. No distress.  HENT: Normocephalic. External right and left ear normal. Oropharynx is clear and moist.  Eyes: Conjunctivae and EOM are normal. PERRLA, no scleral icterus.  Neck: Normal ROM. Neck supple. No JVD. No tracheal deviation. No thyromegaly.  CVS: RRR, S1/S2 +, no murmurs, no gallops, no carotid bruit.  Pulmonary: Effort and breath sounds normal, no stridor, rhonchi, wheezes, rales.  Abdominal: Soft. BS +,  no distension, tenderness, rebound or guarding.  Musculoskeletal: Normal range of motion. No edema and no tenderness.  Lymphadenopathy: No lymphadenopathy noted, cervical, inguinal. Neuro: Alert. Normal reflexes, muscle tone coordination. No cranial nerve deficit. Skin: Skin is warm and dry. No rash noted. Not diaphoretic. No erythema. No  pallor.  Psychiatric: Normal mood and affect. Behavior, judgment, thought content normal.   Lab Results  Component Value Date   WBC 14.4* 10/03/2013   HGB 11.9* 10/03/2013   HCT 35.5* 10/03/2013   MCV 75.5* 10/03/2013   PLT 328 10/03/2013   Lab Results   Component Value Date   CREATININE 0.81 10/03/2013   BUN 6 10/03/2013   NA 141 10/03/2013   K 3.9 10/03/2013   CL 105 10/03/2013   CO2 26 10/03/2013    No results found for this basename: HGBA1C   Lipid Panel  No results found for this basename: chol, trig, hdl, cholhdl, vldl, ldlcalc       Assessment and plan:   Patient Active Problem List   Diagnosis Date Noted  . EPISTAXIS, RECURRENT 04/12/2009  . RHINITIS, ALLERGIC 01/17/2007   Hypertension, questionable secondary? Concern about secondary causes such as pheochromocytoma, cushing, hyperthyroidism, renal artery stenosis, hyperaldosteronism? We'll start off with blood work for secondary hypertension If The values are abnormal further workup will be done Patient is a nonsmoker Prior UDS has been negative RN visit for blood pressure check in 2-3 weeks   Establish care Patient requesting flu vaccine today Obtain baseline labs     The patient was given clear instructions to go to ER or return to medical center if symptoms don't improve, worsen or new problems develop. The patient verbalized understanding. The patient was told to call to get any lab results if not heard anything in the next week.

## 2014-02-20 NOTE — Progress Notes (Signed)
Patient here to establish care.  Patient donates plasma but was unable to donate plasma on 01/12/14 because of high blood pressure.  He indicates he needs a form completed to indicated whether he is able to donate plasma again. Patient denies pain.

## 2014-02-21 LAB — VITAMIN D 25 HYDROXY (VIT D DEFICIENCY, FRACTURES): Vit D, 25-Hydroxy: <10 ng/mL — ABNORMAL LOW (ref 30–89)

## 2014-02-25 ENCOUNTER — Ambulatory Visit: Payer: Self-pay

## 2014-02-25 LAB — CATECHOLAMINES, FRACTIONATED, PLASMA
CATECHOLAMINES, TOTAL: 655 pg/mL
Epinephrine: 99 pg/mL
NOREPINEPHRINE: 556 pg/mL

## 2014-02-25 LAB — METANEPHRINES, PLASMA
NORMETANEPHRINE FREE: 76 pg/mL (ref <=148)
TOTAL METANEPHRINES-PLASMA: 76 pg/mL (ref <=205)

## 2014-02-27 LAB — ALDOSTERONE + RENIN ACTIVITY W/ RATIO
ALDO / PRA Ratio: 4.1 Ratio (ref 0.9–28.9)
ALDOSTERONE: 5 ng/dL
PRA LC/MS/MS: 1.22 ng/mL/h (ref 0.25–5.82)

## 2014-02-27 MED ORDER — LISINOPRIL 20 MG PO TABS: 20.0000 mg | ORAL_TABLET | Freq: Every day | ORAL | Status: DC

## 2014-02-27 MED ORDER — VITAMIN D (ERGOCALCIFEROL) 1.25 MG (50000 UNIT) PO CAPS: 50000.0000 [IU] | ORAL_CAPSULE | ORAL | Status: DC

## 2014-02-27 NOTE — Addendum Note (Signed)
Addended by: Susie CassetteABROL MD, Germain OsgoodNAYANA on: 02/27/2014 04:53 PM   Modules accepted: Orders, Medications

## 2014-03-02 ENCOUNTER — Telehealth: Payer: Self-pay | Admitting: Emergency Medicine

## 2014-03-02 NOTE — Telephone Encounter (Signed)
Message copied by Darlis LoanSMITH, Zeniya Lapidus D on Mon Mar 02, 2014  4:17 PM ------      Message from: Susie CassetteABROL MD, Germain OsgoodNAYANA      Created: Fri Feb 27, 2014  4:54 PM       Notify patient that blood work for secondary hypertension is negative. Patient has a low vitamin D, [facility]      Hypertension the patient was prescribed hydrochlorothiazide. Will recommend to discontinue hydrochlorothiazide because of low sodium. The patient has instead prescribed lisinopril.      Patient is unfit to donate plasma because of his anemia, hypertension ------

## 2014-03-02 NOTE — Telephone Encounter (Signed)
Pt given lab results with new medication instructions Pt states he never started taking HCTZ Both medication sent to [facility] pharmacy Pt instructed not to donate plasma due to anemia,HTN

## 2014-05-04 ENCOUNTER — Encounter (HOSPITAL_COMMUNITY): Payer: Self-pay | Admitting: Emergency Medicine

## 2014-05-04 ENCOUNTER — Emergency Department (HOSPITAL_COMMUNITY)
Admission: EM | Admit: 2014-05-04 | Discharge: 2014-05-04 | Disposition: A | Discharge status: Home / Self Care | Payer: Self-pay | Attending: Emergency Medicine | Admitting: Emergency Medicine

## 2014-05-04 DIAGNOSIS — Z23 Encounter for immunization: Secondary | ICD-10-CM | POA: Insufficient documentation

## 2014-05-04 DIAGNOSIS — Y9389 Activity, other specified: Secondary | ICD-10-CM | POA: Insufficient documentation

## 2014-05-04 DIAGNOSIS — X12XXXA Contact with other hot fluids, initial encounter: Secondary | ICD-10-CM | POA: Insufficient documentation

## 2014-05-04 DIAGNOSIS — Y9289 Other specified places as the place of occurrence of the external cause: Secondary | ICD-10-CM | POA: Insufficient documentation

## 2014-05-04 DIAGNOSIS — Y990 Civilian activity done for income or pay: Secondary | ICD-10-CM | POA: Insufficient documentation

## 2014-05-04 DIAGNOSIS — T2111XA Burn of first degree of chest wall, initial encounter: Secondary | ICD-10-CM | POA: Insufficient documentation

## 2014-05-04 DIAGNOSIS — Z79899 Other long term (current) drug therapy: Secondary | ICD-10-CM | POA: Insufficient documentation

## 2014-05-04 DIAGNOSIS — T300 Burn of unspecified body region, unspecified degree: Secondary | ICD-10-CM

## 2014-05-04 DIAGNOSIS — X131XXA Other contact with steam and other hot vapors, initial encounter: Secondary | ICD-10-CM

## 2014-05-04 DIAGNOSIS — Z5189 Encounter for other specified aftercare: Secondary | ICD-10-CM

## 2014-05-04 MED ORDER — TETANUS-DIPHTH-ACELL PERTUSSIS 5-2.5-18.5 LF-MCG/0.5 IM SUSP
0.5000 mL | Freq: Once | INTRAMUSCULAR | Status: AC
  Administered 2014-05-04: 0.5 mL via INTRAMUSCULAR
  Filled 2014-05-04: qty 0.5

## 2014-05-04 NOTE — ED Provider Notes (Signed)
  Medical screening examination/treatment/procedure(s) were performed by non-physician practitioner and as supervising physician I was immediately available for consultation/collaboration.   EKG Interpretation None         Arfa Lamarca, MD 05/04/14 1626 
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
## 2014-05-04 NOTE — Discharge Instructions (Signed)
Scar Minimization You will have a scar anytime you have surgery and a cut is made in the skin or you have something removed from your skin (mole, skin cancer, cyst). Although scars are unavoidable following surgery, there are ways to minimize their appearance. It is important to follow all the instructions you receive from your caregiver about wound care. How your wound heals will influence the appearance of your scar. If you do not follow the wound care instructions as directed, complications such as infection may occur. Wound instructions include keeping the wound clean, moist, and not letting the wound form a scab. Some people form scars that are raised and lumpy (hypertrophic) or larger than the initial wound (keloidal). HOME CARE INSTRUCTIONS   Follow wound care instructions as directed.  Keep the wound clean by washing it with soap and water.  Keep the wound moist with provided antibiotic cream or petroleum jelly until completely healed. Moisten twice a day for about 2 weeks.  Get stitches (sutures) taken out at the scheduled time.  Avoid touching or manipulating your wound unless needed. Wash your hands thoroughly before and after touching your wound.  Follow all restrictions such as limits on exercise or work. This depends on where your scar is located.  Keep the scar protected from sunburn. Cover the scar with sunscreen/sunblock with SPF 30 or higher.  Gently massage the scar using a circular motion to help minimize the appearance of the scar. Do this only after the wound has closed and all the sutures have been removed.  For hypertrophic or keloidal scars, there are several ways to treat and minimize their appearance. Methods include compression therapy, intralesional corticosteroids, laser therapy, or surgery. These methods are performed by your caregiver. Remember that the scar may appear lighter or darker than your normal skin color. This difference in color should even out with  time. SEEK MEDICAL CARE IF:   You have a fever.  You develop signs of infection such as pain, redness, pus, and warmth.  You have questions or concerns. Document Released: 04/26/2010 Document Revised: 01/29/2012 Document Reviewed: 04/26/2010 [facility] Patient Information 2014 [place], [place].  Wound Care Wound care helps prevent pain and infection.  You may need a tetanus shot if:  You cannot remember when you had your last tetanus shot.  You have never had a tetanus shot.  The injury broke your skin. If you need a tetanus shot and you choose not to have one, you may get tetanus. Sickness from tetanus can be serious. HOME CARE   Only take medicine as told by your doctor.  Clean the wound daily with mild soap and water.  Change any bandages (dressings) as told by your doctor.  Put medicated cream and a bandage on the wound as told by your doctor.  Change the bandage if it gets wet, dirty, or starts to smell.  Take showers. Do not take baths, swim, or do anything that puts your wound under water.  Rest and raise (elevate) the wound until the pain and puffiness (swelling) are better.  Keep all doctor visits as told. GET HELP RIGHT AWAY IF:   Yellowish-white fluid (pus) comes from the wound.  Medicine does not lessen your pain.  There is a red streak going away from the wound.  You have a fever. MAKE SURE YOU:   Understand these instructions.  Will watch your condition.  Will get help right away if you are not doing well or get worse. Document Released: 08/15/2008 Document Revised:  01/29/2012 Document Reviewed: 03/12/2011 [employer] Patient Information 2014 [place], [place].

## 2014-05-25 ENCOUNTER — Ambulatory Visit: Payer: Self-pay | Admitting: Internal Medicine

## 2014-07-30 ENCOUNTER — Encounter (HOSPITAL_COMMUNITY): Payer: Self-pay | Admitting: Emergency Medicine

## 2014-07-30 ENCOUNTER — Emergency Department (HOSPITAL_COMMUNITY)
Admission: EM | Admit: 2014-07-30 | Discharge: 2014-07-30 | Disposition: A | Discharge status: Home / Self Care | Payer: Self-pay | Attending: Emergency Medicine | Admitting: Emergency Medicine

## 2014-07-30 ENCOUNTER — Emergency Department (HOSPITAL_COMMUNITY): Payer: Self-pay

## 2014-07-30 DIAGNOSIS — Y9289 Other specified places as the place of occurrence of the external cause: Secondary | ICD-10-CM | POA: Insufficient documentation

## 2014-07-30 DIAGNOSIS — S6990XA Unspecified injury of unspecified wrist, hand and finger(s), initial encounter: Principal | ICD-10-CM | POA: Insufficient documentation

## 2014-07-30 DIAGNOSIS — S6980XA Other specified injuries of unspecified wrist, hand and finger(s), initial encounter: Secondary | ICD-10-CM | POA: Insufficient documentation

## 2014-07-30 DIAGNOSIS — S6992XA Unspecified injury of left wrist, hand and finger(s), initial encounter: Secondary | ICD-10-CM

## 2014-07-30 DIAGNOSIS — Y9389 Activity, other specified: Secondary | ICD-10-CM | POA: Insufficient documentation

## 2014-07-30 DIAGNOSIS — IMO0002 Reserved for concepts with insufficient information to code with codable children: Secondary | ICD-10-CM | POA: Insufficient documentation

## 2014-07-30 NOTE — ED Notes (Signed)
Pt c/o left thumb pain after punching refrigerator last night

## 2014-07-30 NOTE — Progress Notes (Signed)
Orthopedic Tech Progress Note Patient Details:  Jose Hernandez [birth year] [id number]  Ortho Devices Type of Ortho Device: Thumb velcro splint Ortho Device/Splint Location: LUE Ortho Device/Splint Interventions: Ordered;Application   Jennye Moccasin 07/30/2014, 3:46 PM
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
## 2014-07-30 NOTE — Discharge Instructions (Signed)
Thumb Sprain °Your exam shows you have a sprained thumb. This means the ligaments around the joint have been torn. Thumb sprains usually take 3-6 weeks to heal. However, severe, unstable sprains may need to be fixed surgically. Sometimes a small piece of bone is pulled off by the ligament. If this is not treated properly, a sprained thumb can lead to a painful, weak joint. Treatment helps reduce pain and shortens the period of disability. °The thumb, and often the wrist, must remain splinted for the first 2-4 weeks to protect the joint. Keep your hand elevated and apply ice packs frequently to the injured area (20-30 minutes every 2-3 hours) for the next 2-4 days. This helps reduce swelling and control pain. Pain medicine may also be used for several days. Motion and strengthening exercises may later be prescribed for the joint to return to normal function. Be sure to see your doctor for follow-up because your thumb joint may require further support with splints, bandages or tape. Please see your doctor or go to the emergency room right away if you have increased pain despite proper treatment, or a numb, cold, or pale thumb. °Document Released: 12/14/2004 Document Revised: 01/29/2012 Document Reviewed: 11/07/2008 °[employer]® Patient Information ©2015 [employer]. This information is not intended to replace advice given to you by your health care provider. Make sure you discuss any questions you have with your health care provider. ° °
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
## 2014-08-04 NOTE — ED Provider Notes (Signed)
Medical screening examination/treatment/procedure(s) were performed by non-physician practitioner and as supervising physician I was immediately available for consultation/collaboration.   EKG Interpretation None        Candyce Churn III, MD 08/04/14 1659

## 2015-03-10 ENCOUNTER — Emergency Department (HOSPITAL_COMMUNITY): Payer: Self-pay

## 2015-03-10 ENCOUNTER — Emergency Department (HOSPITAL_COMMUNITY)
Admission: EM | Admit: 2015-03-10 | Discharge: 2015-03-11 | Disposition: A | Discharge status: Home / Self Care | Payer: Self-pay | Attending: Emergency Medicine | Admitting: Emergency Medicine

## 2015-03-10 ENCOUNTER — Encounter (HOSPITAL_COMMUNITY): Payer: Self-pay | Admitting: Emergency Medicine

## 2015-03-10 DIAGNOSIS — Y9289 Other specified places as the place of occurrence of the external cause: Secondary | ICD-10-CM | POA: Insufficient documentation

## 2015-03-10 DIAGNOSIS — S20211A Contusion of right front wall of thorax, initial encounter: Secondary | ICD-10-CM | POA: Insufficient documentation

## 2015-03-10 DIAGNOSIS — Y998 Other external cause status: Secondary | ICD-10-CM | POA: Insufficient documentation

## 2015-03-10 DIAGNOSIS — Y9389 Activity, other specified: Secondary | ICD-10-CM | POA: Insufficient documentation
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
## 2015-03-11 MED ORDER — NAPROXEN 500 MG PO TABS: 500.0000 mg | ORAL_TABLET | Freq: Two times a day (BID) | ORAL | Status: DC

## 2015-03-11 NOTE — Discharge Instructions (Signed)
Take the prescribed medication as directed.  Continue using incentive spirometer at home. Return to the ED for new or worsening symptoms-- worsening pain, shortness of breath, etc.

## 2015-03-11 NOTE — ED Notes (Signed)
Incentive Spirometry teaching completed, return demonstration completed.
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
## 2016-03-07 ENCOUNTER — Emergency Department (HOSPITAL_COMMUNITY): Payer: Self-pay

## 2016-03-07 ENCOUNTER — Observation Stay (HOSPITAL_COMMUNITY)
Admission: EM | Admit: 2016-03-07 | Discharge: 2016-03-08 | Disposition: A | Discharge status: Home / Self Care | Payer: Self-pay | Attending: Surgery | Admitting: Surgery

## 2016-03-07 ENCOUNTER — Encounter (HOSPITAL_COMMUNITY): Admission: EM | Disposition: A | Discharge status: Home / Self Care | Payer: Self-pay | Source: Home / Self Care

## 2016-03-07 ENCOUNTER — Emergency Department (HOSPITAL_COMMUNITY): Payer: Self-pay | Admitting: Anesthesiology

## 2016-03-07 ENCOUNTER — Encounter (HOSPITAL_COMMUNITY): Payer: Self-pay | Admitting: *Deleted

## 2016-03-07 DIAGNOSIS — K612 Anorectal abscess: Principal | ICD-10-CM | POA: Insufficient documentation

## 2016-03-07 DIAGNOSIS — Z791 Long term (current) use of non-steroidal anti-inflammatories (NSAID): Secondary | ICD-10-CM | POA: Insufficient documentation

## 2016-03-07 DIAGNOSIS — K611 Rectal abscess: Secondary | ICD-10-CM | POA: Diagnosis present

## 2016-03-07 HISTORY — PX: INCISION AND DRAINAGE PERIRECTAL ABSCESS: SHX1804

## 2016-03-07 LAB — CBC WITH DIFFERENTIAL/PLATELET
BASOS PCT: 0 %
Basophils Absolute: 0 10*3/uL (ref 0.0–0.1)
Eosinophils Absolute: 0 10*3/uL (ref 0.0–0.7)
Eosinophils Relative: 0 %
HEMATOCRIT: 36.1 % — AB (ref 39.0–52.0)
HEMOGLOBIN: 11.4 g/dL — AB (ref 13.0–17.0)
LYMPHS PCT: 8 %
Lymphs Abs: 1.4 10*3/uL (ref 0.7–4.0)
MCH: 25.6 pg — ABNORMAL LOW (ref 26.0–34.0)
MCHC: 31.6 g/dL (ref 30.0–36.0)
MCV: 81.1 fL (ref 78.0–100.0)
MONOS PCT: 11 %
Monocytes Absolute: 1.9 10*3/uL — ABNORMAL HIGH (ref 0.1–1.0)
NEUTROS PCT: 81 %
Neutro Abs: 14.4 10*3/uL — ABNORMAL HIGH (ref 1.7–7.7)
Platelets: 353 10*3/uL (ref 150–400)
RBC: 4.45 MIL/uL (ref 4.22–5.81)
RDW: 14.4 % (ref 11.5–15.5)
WBC: 17.8 10*3/uL — ABNORMAL HIGH (ref 4.0–10.5)

## 2016-03-07 LAB — BASIC METABOLIC PANEL
ANION GAP: 9 (ref 5–15)
BUN: <5 mg/dL — ABNORMAL LOW (ref 6–20)
CO2: 25 mmol/L (ref 22–32)
CREATININE: 0.91 mg/dL (ref 0.61–1.24)
Calcium: 9 mg/dL (ref 8.9–10.3)
Chloride: 100 mmol/L — ABNORMAL LOW (ref 101–111)
GFR calc non Af Amer: >60 mL/min (ref >60)
Glucose, Bld: 108 mg/dL — ABNORMAL HIGH (ref 65–99)
Potassium: 4.3 mmol/L (ref 3.5–5.1)
Sodium: 134 mmol/L — ABNORMAL LOW (ref 135–145)

## 2016-03-07 SURGERY — INCISION AND DRAINAGE, ABSCESS, PERIRECTAL
Anesthesia: General | Site: Rectum

## 2016-03-07 MED ORDER — DEXTROSE 5 % IV SOLN
1.0000 g | Freq: Once | INTRAVENOUS | Status: AC
  Administered 2016-03-07: 1 g via INTRAVENOUS

## 2016-03-07 MED ORDER — MORPHINE SULFATE (PF) 2 MG/ML IV SOLN: 2.0000 mg | INTRAVENOUS | Status: DC | PRN

## 2016-03-07 MED ORDER — FENTANYL CITRATE (PF) 250 MCG/5ML IJ SOLN
INTRAMUSCULAR | Status: AC
  Filled 2016-03-07: qty 5

## 2016-03-07 MED ORDER — DIPHENHYDRAMINE HCL 50 MG/ML IJ SOLN
25.0000 mg | Freq: Four times a day (QID) | INTRAMUSCULAR | Status: DC | PRN
  Administered 2016-03-07: 25 mg via INTRAVENOUS

## 2016-03-07 MED ORDER — LIDOCAINE HCL (PF) 1 % IJ SOLN
5.0000 mL | Freq: Once | INTRAMUSCULAR | Status: AC
  Administered 2016-03-07: 5 mL
  Filled 2016-03-07: qty 5

## 2016-03-07 MED ORDER — MEPERIDINE HCL 25 MG/ML IJ SOLN: 6.2500 mg | INTRAMUSCULAR | Status: DC | PRN

## 2016-03-07 MED ORDER — IOPAMIDOL (ISOVUE-300) INJECTION 61%
INTRAVENOUS | Status: AC
  Administered 2016-03-07: 100 mL
  Filled 2016-03-07: qty 100

## 2016-03-07 MED ORDER — METHOCARBAMOL 500 MG PO TABS: 500.0000 mg | ORAL_TABLET | Freq: Four times a day (QID) | ORAL | Status: DC | PRN

## 2016-03-07 MED ORDER — SODIUM CHLORIDE 0.9 % IV BOLUS (SEPSIS)
500.0000 mL | Freq: Once | INTRAVENOUS | Status: AC
  Administered 2016-03-07: 500 mL via INTRAVENOUS

## 2016-03-07 MED ORDER — MIDAZOLAM HCL 5 MG/5ML IJ SOLN
INTRAMUSCULAR | Status: DC | PRN
  Administered 2016-03-07: 2 mg via INTRAVENOUS

## 2016-03-07 MED ORDER — LACTATED RINGERS IV SOLN
INTRAVENOUS | Status: DC | PRN
  Administered 2016-03-07: 22:00:00 via INTRAVENOUS

## 2016-03-07 MED ORDER — FENTANYL CITRATE (PF) 250 MCG/5ML IJ SOLN
INTRAMUSCULAR | Status: DC | PRN
  Administered 2016-03-07: 50 ug via INTRAVENOUS
  Administered 2016-03-07 (×2): 100 ug via INTRAVENOUS

## 2016-03-07 MED ORDER — ONDANSETRON HCL 4 MG/2ML IJ SOLN
INTRAMUSCULAR | Status: DC | PRN
  Administered 2016-03-07: 4 mg via INTRAVENOUS

## 2016-03-07 MED ORDER — DEXTROSE 5 % IV SOLN
INTRAVENOUS | Status: AC
  Filled 2016-03-07: qty 1

## 2016-03-07 MED ORDER — SODIUM CHLORIDE 0.9 % IV SOLN
INTRAVENOUS | Status: DC
Start: 2016-03-08 — End: 2016-03-08
  Administered 2016-03-08: 01:00:00 via INTRAVENOUS

## 2016-03-07 MED ORDER — HYDROMORPHONE HCL 1 MG/ML IJ SOLN
INTRAMUSCULAR | Status: AC
  Filled 2016-03-07: qty 1

## 2016-03-07 MED ORDER — ENOXAPARIN SODIUM 40 MG/0.4ML ~~LOC~~ SOLN
40.0000 mg | SUBCUTANEOUS | Status: DC
  Administered 2016-03-08: 40 mg via SUBCUTANEOUS
  Filled 2016-03-07: qty 0.4

## 2016-03-07 MED ORDER — ONDANSETRON 4 MG PO TBDP: 4.0000 mg | ORAL_TABLET | Freq: Four times a day (QID) | ORAL | Status: DC | PRN

## 2016-03-07 MED ORDER — MIDAZOLAM HCL 2 MG/2ML IJ SOLN: 0.5000 mg | Freq: Once | INTRAMUSCULAR | Status: DC | PRN

## 2016-03-07 MED ORDER — SODIUM BICARBONATE 4 % IV SOLN
5.0000 mL | Freq: Once | INTRAVENOUS | Status: AC
  Administered 2016-03-07: 5 mL via SUBCUTANEOUS
  Filled 2016-03-07: qty 5

## 2016-03-07 MED ORDER — MIDAZOLAM HCL 2 MG/2ML IJ SOLN
INTRAMUSCULAR | Status: AC
  Filled 2016-03-07: qty 2

## 2016-03-07 MED ORDER — LIDOCAINE HCL (CARDIAC) 20 MG/ML IV SOLN
INTRAVENOUS | Status: DC | PRN
  Administered 2016-03-07: 40 mg via INTRATRACHEAL

## 2016-03-07 MED ORDER — DIPHENHYDRAMINE HCL 25 MG PO CAPS: 25.0000 mg | ORAL_CAPSULE | Freq: Four times a day (QID) | ORAL | Status: DC | PRN

## 2016-03-07 MED ORDER — HYDROMORPHONE HCL 1 MG/ML IJ SOLN
0.2500 mg | INTRAMUSCULAR | Status: DC | PRN
  Administered 2016-03-07: 0.25 mg via INTRAVENOUS

## 2016-03-07 MED ORDER — PROPOFOL 10 MG/ML IV BOLUS
INTRAVENOUS | Status: DC | PRN
  Administered 2016-03-07: 40 mg via INTRAVENOUS
  Administered 2016-03-07: 200 mg via INTRAVENOUS

## 2016-03-07 MED ORDER — ONDANSETRON HCL 4 MG/2ML IJ SOLN: 4.0000 mg | Freq: Four times a day (QID) | INTRAMUSCULAR | Status: DC | PRN

## 2016-03-07 MED ORDER — 0.9 % SODIUM CHLORIDE (POUR BTL) OPTIME
TOPICAL | Status: DC | PRN
  Administered 2016-03-07: 1000 mL

## 2016-03-07 MED ORDER — SUCCINYLCHOLINE CHLORIDE 20 MG/ML IJ SOLN
INTRAMUSCULAR | Status: DC | PRN
  Administered 2016-03-07: 120 mg via INTRAVENOUS

## 2016-03-07 MED ORDER — KETOROLAC TROMETHAMINE 30 MG/ML IJ SOLN
30.0000 mg | Freq: Four times a day (QID) | INTRAMUSCULAR | Status: DC
  Administered 2016-03-08 (×3): 30 mg via INTRAVENOUS
  Filled 2016-03-07 (×3): qty 1

## 2016-03-07 MED ORDER — OXYCODONE-ACETAMINOPHEN 5-325 MG PO TABS
1.0000 | ORAL_TABLET | ORAL | Status: DC | PRN
  Administered 2016-03-08: 2 via ORAL
  Filled 2016-03-07: qty 2

## 2016-03-07 MED ORDER — DIPHENHYDRAMINE HCL 50 MG/ML IJ SOLN
INTRAMUSCULAR | Status: AC
  Filled 2016-03-07: qty 1

## 2016-03-07 MED ORDER — GELATIN ABSORBABLE 12-7 MM EX MISC
CUTANEOUS | Status: DC | PRN
  Administered 2016-03-07: 1

## 2016-03-07 MED ORDER — HYDROMORPHONE HCL 1 MG/ML IJ SOLN
1.0000 mg | Freq: Once | INTRAMUSCULAR | Status: AC
  Administered 2016-03-07: 1 mg via INTRAVENOUS
  Filled 2016-03-07: qty 1

## 2016-03-07 SURGICAL SUPPLY — 32 items
BNDG GAUZE ELAST 4 BULKY (GAUZE/BANDAGES/DRESSINGS) IMPLANT
COVER MAYO STAND STRL (DRAPES) ×3 IMPLANT
COVER SURGICAL LIGHT HANDLE (MISCELLANEOUS) ×3 IMPLANT
DRAIN PENROSE 1/2X12 LTX STRL (WOUND CARE) ×2 IMPLANT
DRSG PAD ABDOMINAL 8X10 ST (GAUZE/BANDAGES/DRESSINGS) ×3 IMPLANT
ELECT CAUTERY BLADE 6.4 (BLADE) ×3 IMPLANT
ELECT REM PT RETURN 9FT ADLT (ELECTROSURGICAL) ×3
ELECTRODE REM PT RTRN 9FT ADLT (ELECTROSURGICAL) ×1 IMPLANT
GAUZE SPONGE 4X4 12PLY STRL (GAUZE/BANDAGES/DRESSINGS) IMPLANT
GLOVE BIO SURGEON STRL SZ7 (GLOVE) ×3 IMPLANT
GLOVE BIOGEL PI IND STRL 7.5 (GLOVE) ×1 IMPLANT
GLOVE BIOGEL PI INDICATOR 7.5 (GLOVE) ×4
GLOVE SURG SS PI 7.5 STRL IVOR (GLOVE) ×4 IMPLANT
GOWN STRL REUS W/ TWL LRG LVL3 (GOWN DISPOSABLE) ×2 IMPLANT
GOWN STRL REUS W/TWL LRG LVL3 (GOWN DISPOSABLE) ×6
KIT BASIN OR (CUSTOM PROCEDURE TRAY) ×3 IMPLANT
KIT ROOM TURNOVER OR (KITS) ×3 IMPLANT
NS IRRIG 1000ML POUR BTL (IV SOLUTION) ×3 IMPLANT
PACK LITHOTOMY IV (CUSTOM PROCEDURE TRAY) ×3 IMPLANT
PAD ARMBOARD 7.5X6 YLW CONV (MISCELLANEOUS) ×3 IMPLANT
PENCIL BUTTON HOLSTER BLD 10FT (ELECTRODE) ×3 IMPLANT
SPONGE GAUZE 4X4 12PLY STER LF (GAUZE/BANDAGES/DRESSINGS) ×2 IMPLANT
SPONGE LAP 18X18 X RAY DECT (DISPOSABLE) ×3 IMPLANT
SURGILUBE 2OZ TUBE FLIPTOP (MISCELLANEOUS) ×3 IMPLANT
SUT ETHILON 2 0 FS 18 (SUTURE) ×2 IMPLANT
SYR BULB 3OZ (MISCELLANEOUS) ×3 IMPLANT
TAPE CLOTH SURG 6X10 WHT LF (GAUZE/BANDAGES/DRESSINGS) ×2 IMPLANT
TOWEL OR 17X24 6PK STRL BLUE (TOWEL DISPOSABLE) ×3 IMPLANT
TOWEL OR 17X26 10 PK STRL BLUE (TOWEL DISPOSABLE) ×3 IMPLANT
TUBE CONNECTING 12'X1/4 (SUCTIONS) ×1
TUBE CONNECTING 12X1/4 (SUCTIONS) ×2 IMPLANT
YANKAUER SUCT BULB TIP NO VENT (SUCTIONS) ×3 IMPLANT

## 2016-03-07 NOTE — Op Note (Signed)
Preop diagnosis: Perirectal abscess Postop diagnosis: Same Procedure performed: Incision and drainage of perirectal abscess Surgeon:Jaeanna Mccomber K. Anesthesia: Gen. Indications: This is a 25 year old male in good health who presents with a one-week history of worsening abscess posterior to his rectum. This has become quite large and squiggly tender. He presented to the emergency department for evaluation. CT scan showed a 4 cm abscess posterior to the rectum. This is too tender to drain in the emergency department. We will bring him to the operating room for surgical drainage.  Description of procedure: The patient is brought to the operating room placed in supine position on the operating room table. After an adequate level of general anesthesia was obtained, his legs were placed in lithotomy position in yellowfin stirrups. His perineum was prepped with Betadine and draped sterile fashion. A timeout was taken to ensure the proper patient and proper procedure. On digital examination there is fluctuance posteriorly.  Fluid is seen draining from the rectum but the internal opening is not identified clearly. We inserted silver bullet retractor and identified a posterior abscess. We bluntly dissected into the abscess cavity above the dentate line and drained a large amount of purulent fluid. I made a small counter incision posteriorly on the perianal skin and tunneled into the abscess. We irrigated the abscess thoroughly. I passed the half-inch Penrose drain from the counterincision exteriorly to exit the internal opening. This was secured with 2 interrupted 3-0 nylon sutures. The Penrose drain was cut to fit the wound. We packed the anal canal with Gelfoam. A dry dressing was applied. The patient was then extubated and brought to recovery was stable condition. All sponge, instrument, and needle counts are correct.    Wilmon ArmsMatthew K. Corliss Skainssuei, MD, [facility]  General/ Trauma  Surgery  03/07/2016 11:01 PM

## 2016-03-07 NOTE — ED Notes (Signed)
Labs ordered for concern of rectal abscess due to chills and pain on right buttock

## 2016-03-07 NOTE — Transfer of Care (Signed)
Immediate Anesthesia Transfer of Care Note  Patient: Jose Hernandez  Procedure(s) Performed: Procedure(s): IRRIGATION AND DEBRIDEMENT PERIRECTAL ABSCESS (N/A)  Patient Location: PACU  Anesthesia Type:General  Level of Consciousness: sedated  Airway & Oxygen Therapy: Patient Spontanous Breathing and Patient connected to face mask oxygen  Post-op Assessment: Report given to RN and Post -op Vital signs reviewed and stable  Post vital signs: Reviewed and stable  Last Vitals:  Filed Vitals:   03/07/16 2130 03/07/16 2307  BP: 137/80 110/60  Pulse: 90 93  Temp:  36.7 C  Resp:  31    Complications: No apparent anesthesia complications

## 2016-03-07 NOTE — Anesthesia Preprocedure Evaluation (Addendum)
Anesthesia Evaluation  Patient identified by MRN, date of birth, ID band Patient awake    Reviewed: Allergy & Precautions, NPO status , Patient's Chart, lab work & pertinent test results  History of Anesthesia Complications Negative for: history of anesthetic complications  Airway Mallampati: I  TM Distance: >3 FB Neck ROM: Full    Dental  (+) Teeth Intact, Dental Advisory Given   Pulmonary neg pulmonary ROS,    breath sounds clear to auscultation       Cardiovascular negative cardio ROS   Rhythm:Regular Rate:Normal     Neuro/Psych negative neurological ROS     GI/Hepatic negative GI ROS, Neg liver ROS,   Endo/Other  negative endocrine ROS  Renal/GU negative Renal ROS     Musculoskeletal negative musculoskeletal ROS (+)   Abdominal   Peds  Hematology negative hematology ROS (+)   Anesthesia Other Findings   Reproductive/Obstetrics                            Anesthesia Physical Anesthesia Plan  ASA: I and emergent  Anesthesia Plan: General   Post-op Pain Management:    Induction: Intravenous, Rapid sequence and Cricoid pressure planned  Airway Management Planned: Oral ETT  Additional Equipment:   Intra-op Plan:   Post-operative Plan: Extubation in OR  Informed Consent: I have reviewed the patients History and Physical, chart, labs and discussed the procedure including the risks, benefits and alternatives for the proposed anesthesia with the patient or authorized representative who has indicated his/her understanding and acceptance.   Dental advisory given  Plan Discussed with: CRNA, Anesthesiologist and Surgeon  Anesthesia Plan Comments: (Plan routine monitors, GETA )       Anesthesia Quick Evaluation

## 2016-03-07 NOTE — Anesthesia Procedure Notes (Signed)
Procedure Name: Intubation Date/Time: 03/07/2016 10:34 PM Performed by: Brien MatesMAHONY, Rogue Pautler D Pre-anesthesia Checklist: Patient identified, Emergency Drugs available, Suction available, Patient being monitored and Timeout performed Patient Re-evaluated:Patient Re-evaluated prior to inductionOxygen Delivery Method: Circle system utilized Preoxygenation: Pre-oxygenation with 100% oxygen Intubation Type: IV induction, Rapid sequence and Cricoid Pressure applied Laryngoscope Size: Miller and 2 Grade View: Grade I Tube type: Oral Tube size: 7.5 mm Number of attempts: 1 Airway Equipment and Method: Stylet Placement Confirmation: ETT inserted through vocal cords under direct vision,  positive ETCO2 and breath sounds checked- equal and bilateral Secured at: 22 cm Tube secured with: Tape Dental Injury: Teeth and Oropharynx as per pre-operative assessment

## 2016-03-07 NOTE — ED Notes (Signed)
Pt here with increasing rectal pain x 1 week.  No external hemorrhoids visible.  Pain radiates into buttock and back.  Pt feels like he has had chills

## 2016-03-07 NOTE — ED Provider Notes (Signed)
CSN: [id number]     Arrival date & time 03/07/16  1354 History   First MD Initiated Contact with Patient 03/07/16 1719     Chief Complaint  Patient presents with  . Rectal Pain     The history is provided by the patient.  Patient's had rectal pain over the last week. It is dull. Initially had some blood with bowel movements. Now is more pain both with bowel movements and without. Fevers. States no change in the stool. States he put some hydrocortisone cream on without relief. The pain is dull and constant. No abdominal pain. States nothing has been up his rectum.  Past Medical History  Diagnosis Date  . History of epistaxis     years ago   History reviewed. No pertinent past surgical history. History reviewed. No pertinent family history. Social History  Substance Use Topics  . Smoking status: Never Smoker   . Smokeless tobacco: Never Used  . Alcohol Use: 1.8 oz/week    3 Cans of beer per week     Comment: ETOH on board today    Review of Systems  Constitutional: Negative for fever, activity change and appetite change.  Respiratory: Negative for shortness of breath.   Cardiovascular: Negative for chest pain.  Gastrointestinal: Positive for rectal pain. Negative for nausea, vomiting, diarrhea and constipation.  Genitourinary: Negative for penile swelling and genital sores.  Musculoskeletal: Positive for myalgias. Negative for back pain.  Skin: Negative for wound.  Neurological: Negative for headaches.      Allergies  Review of patient's allergies indicates no known allergies.  Home Medications   Prior to Admission medications   Medication Sig Start Date End Date Taking? Authorizing Provider  naproxen (NAPROSYN) 500 MG tablet Take 1 tablet (500 mg total) by mouth 2 (two) times daily with a meal. 03/11/15   Garlon HatchetLisa M Sanders, PA-C   BP 126/63 mmHg  Pulse 87  Temp(Src) 98.7 F (37.1 C) (Oral)  Resp 22  Ht 5\' 9"  (1.753 m)  Wt 184 lb 1.4 oz (83.5 kg)  BMI 27.17 kg/m2   SpO2 100% Physical Exam  Constitutional: He appears well-developed.  Neck: Neck supple.  Cardiovascular:  Mild tachycardia  Pulmonary/Chest: Effort normal.  Abdominal: There is no tenderness.  Genitourinary:  Some fullness around about half of his anal sphincter. There is some stool in his anal area also. No clear fluctuance. No clear drainage.  Musculoskeletal: Normal range of motion.  Neurological: He is alert.  Skin: Skin is warm.    ED Course  Procedures (including critical care time) Labs Review Labs Reviewed  CBC WITH DIFFERENTIAL/PLATELET - Abnormal; Notable for the following:    WBC 17.8 (*)    Hemoglobin 11.4 (*)    HCT 36.1 (*)    MCH 25.6 (*)    Neutro Abs 14.4 (*)    Monocytes Absolute 1.9 (*)    All other components within normal limits  BASIC METABOLIC PANEL - Abnormal; Notable for the following:    Sodium 134 (*)    Chloride 100 (*)    Glucose, Bld 108 (*)    BUN <5 (*)    All other components within normal limits  CBC  CREATININE, SERUM  CBC    Imaging Review Ct Pelvis W Contrast  03/07/2016  CLINICAL DATA:  Rectal pain for 1 week.  Diarrhea. EXAM: CT PELVIS WITH CONTRAST TECHNIQUE: Multidetector CT imaging of the pelvis was performed using the standard protocol following the bolus administration of intravenous contrast. CONTRAST:  ISOVUE-300 IOPAMIDOL (ISOVUE-300) INJECTION 61% COMPARISON:  None. FINDINGS: Lower urinary tract: Unremarkable Bowel: Accentuated mucosal enhancement in the cecum. Air-fluid level in the rectum compatible with diarrheal process. Perianal fluid collection posteriorly, primarily appears contained by the external sphincter without obvious extension into the ischiorectal fossa, collection measuring 4.4 by 1.2 by 3.1 cm on images 44/2 and 65/4. Vascular/Lymphatic: Unremarkable Reproductive: Right varicocele, venous structure measuring 6 mm in diameter identified. Other: Minimal presacral edema. Musculoskeletal: Unremarkable  IMPRESSION: 1. Posterior perianal abscess, 4.4 by 1.2 by 3.1 cm. 2. Air- fluid level in the rectum compatible with diarrheal process. Mildly accentuated mucosal enhancement in the cecum may reflect underlying low-grade colitis. Much of the colon is not included on today' s pelvic CT. 3. Right scrotal varicocele. Electronically Signed   By: Gaylyn Rong M.D.   On: 03/07/2016 18:59   I have personally reviewed and evaluated these images and lab results as part of my medical decision-making.   EKG Interpretation None      MDM   Final diagnoses:  Perirectal abscess    Patient with tender fluctuant area and perianal abscess. Attempted I and D in ER but was unable to get purulent drainage. Seen by general surgery who will take to the OR.INCISION AND DRAINAGE Performed by: Billee Cashing. Consent: Verbal consent obtained. Risks and benefits: risks, benefits and alternatives were discussed Type: abscess  Body area: perianal area  Anesthesia: local infiltration  Incision was made with a scalpel.  Local anesthetic: lidocaine 1% with neut  Anesthetic total: 3 ml  Drainage:blood Unable to drain purulence  Patient tolerance: Patient tolerated the procedure well with no immediate complications.      Benjiman Core, MD 03/08/16 [phone]

## 2016-03-07 NOTE — H&P (Signed)
Jose Hernandez is an 25 y.o. male.   Chief Complaint: Perirectal abscess HPI: This is a 25 yo male who presents with a one week history of worsening perirectal pain and swelling.  He came to the ED today and was found to have a posterior perirectal abscess.  Too tender for bedside drainage.  Past Medical History  Diagnosis Date  . History of epistaxis     years ago    History reviewed. No pertinent past surgical history.  No family history on file. Social History:  reports that he has never smoked. He has never used smokeless tobacco. He reports that he drinks about 1.8 oz of alcohol per week. His drug history is not on file.  Allergies: No Known Allergies  Prior to Admission medications   Medication Sig Start Date End Date Taking? Authorizing Provider  naproxen (NAPROSYN) 500 MG tablet Take 1 tablet (500 mg total) by mouth 2 (two) times daily with a meal. 03/11/15   Larene Pickett, PA-C     Results for orders placed or performed during the hospital encounter of 03/07/16 (from the past 48 hour(s))  CBC with Differential     Status: Abnormal   Collection Time: 03/07/16  2:35 PM  Result Value Ref Range   WBC 17.8 (H) 4.0 - 10.5 K/uL   RBC 4.45 4.22 - 5.81 MIL/uL   Hemoglobin 11.4 (L) 13.0 - 17.0 g/dL   HCT 36.1 (L) 39.0 - 52.0 %   MCV 81.1 78.0 - 100.0 fL   MCH 25.6 (L) 26.0 - 34.0 pg   MCHC 31.6 30.0 - 36.0 g/dL   RDW 14.4 11.5 - 15.5 %   Platelets 353 150 - 400 K/uL   Neutrophils Relative % 81 %   Neutro Abs 14.4 (H) 1.7 - 7.7 K/uL   Lymphocytes Relative 8 %   Lymphs Abs 1.4 0.7 - 4.0 K/uL   Monocytes Relative 11 %   Monocytes Absolute 1.9 (H) 0.1 - 1.0 K/uL   Eosinophils Relative 0 %   Eosinophils Absolute 0.0 0.0 - 0.7 K/uL   Basophils Relative 0 %   Basophils Absolute 0.0 0.0 - 0.1 K/uL  Basic metabolic panel     Status: Abnormal   Collection Time: 03/07/16  2:35 PM  Result Value Ref Range   Sodium 134 (L) 135 - 145 mmol/L   Potassium 4.3 3.5 - 5.1 mmol/L   Chloride 100 (L) 101 - 111 mmol/L   CO2 25 22 - 32 mmol/L   Glucose, Bld 108 (H) 65 - 99 mg/dL   BUN <5 (L) 6 - 20 mg/dL   Creatinine, Ser 0.91 0.61 - 1.24 mg/dL   Calcium 9.0 8.9 - 10.3 mg/dL   GFR calc non Af Amer >60 >60 mL/min   GFR calc Af Amer >60 >60 mL/min    Comment: (NOTE) The eGFR has been calculated using the CKD EPI equation. This calculation has not been validated in all clinical situations. eGFR's persistently <60 mL/min signify possible Chronic Kidney Disease.    Anion gap 9 5 - 15   Ct Pelvis W Contrast  03/07/2016  CLINICAL DATA:  Rectal pain for 1 week.  Diarrhea. EXAM: CT PELVIS WITH CONTRAST TECHNIQUE: Multidetector CT imaging of the pelvis was performed using the standard protocol following the bolus administration of intravenous contrast. CONTRAST:  177m ISOVUE-300 IOPAMIDOL (ISOVUE-300) INJECTION 61% COMPARISON:  None. FINDINGS: Lower urinary tract: Unremarkable Bowel: Accentuated mucosal enhancement in the cecum. Air-fluid level in the rectum compatible with  diarrheal process. Perianal fluid collection posteriorly, primarily appears contained by the external sphincter without obvious extension into the ischiorectal fossa, collection measuring 4.4 by 1.2 by 3.1 cm on images 44/2 and 65/4. Vascular/Lymphatic: Unremarkable Reproductive: Right varicocele, venous structure measuring 6 mm in diameter identified. Other: Minimal presacral edema. Musculoskeletal: Unremarkable IMPRESSION: 1. Posterior perianal abscess, 4.4 by 1.2 by 3.1 cm. 2. Air- fluid level in the rectum compatible with diarrheal process. Mildly accentuated mucosal enhancement in the cecum may reflect underlying low-grade colitis. Much of the colon is not included on today' s pelvic CT. 3. Right scrotal varicocele. Electronically Signed   By: Van Clines M.D.   On: 03/07/2016 18:59    ROS  Blood pressure 116/63, pulse 87, temperature 99.8 F (37.7 C), temperature source Oral, resp. rate 22, weight  79.379 kg (175 lb), SpO2 93 %. Physical Exam  WDWN in NAD Rectal - tender fullness posteriorly; normal sphincter tone  Assessment/Plan Posterior perirectal abscess  To OR for Incision and drainage of perirectal abscess.  The surgical procedure has been discussed with the patient.  Potential risks, benefits, alternative treatments, and expected outcomes have been explained.  All of the patient's questions at this time have been answered.  The likelihood of reaching the patient's treatment goal is good.  The patient understand the proposed surgical procedure and wishes to proceed.   Maia Petties., MD 03/07/2016, 9:33 PM

## 2016-03-07 NOTE — ED Notes (Signed)
Patient transported to CT 

## 2016-03-08 ENCOUNTER — Encounter (HOSPITAL_COMMUNITY): Payer: Self-pay | Admitting: Surgery

## 2016-03-08 LAB — CBC
HCT: 33.7 % — ABNORMAL LOW (ref 39.0–52.0)
HEMOGLOBIN: 10.4 g/dL — AB (ref 13.0–17.0)
MCH: 25 pg — AB (ref 26.0–34.0)
MCHC: 30.9 g/dL (ref 30.0–36.0)
MCV: 81 fL (ref 78.0–100.0)
PLATELETS: 308 10*3/uL (ref 150–400)
RBC: 4.16 MIL/uL — AB (ref 4.22–5.81)
RDW: 14.3 % (ref 11.5–15.5)
WBC: 15 10*3/uL — ABNORMAL HIGH (ref 4.0–10.5)

## 2016-03-08 MED ORDER — AMOXICILLIN-POT CLAVULANATE 875-125 MG PO TABS: 1.0000 | ORAL_TABLET | Freq: Two times a day (BID) | ORAL | Status: AC

## 2016-03-08 MED ORDER — OXYCODONE-ACETAMINOPHEN 5-325 MG PO TABS: 1.0000 | ORAL_TABLET | Freq: Four times a day (QID) | ORAL | Status: DC | PRN

## 2016-03-08 MED ORDER — DOCUSATE SODIUM 250 MG PO CAPS: 250.0000 mg | ORAL_CAPSULE | Freq: Two times a day (BID) | ORAL | Status: DC | PRN

## 2016-03-08 NOTE — Progress Notes (Signed)
Discharge orders received. Pt tolerated po pain meds and shower. MD aware. Pt and significant other educated on discharge instructions. Pt verbalized understanding. Pt given discharge packet, prescriptions, and note for work. IV removed. Pt taken to ED exit by staff via wheelchair.

## 2016-03-08 NOTE — Anesthesia Postprocedure Evaluation (Signed)
Anesthesia Post Note  Patient: Jose Hernandez  Procedure(s) Performed: Procedure(s) (LRB): IRRIGATION AND DEBRIDEMENT PERIRECTAL ABSCESS (N/A)  Patient location during evaluation: PACU Anesthesia Type: General Level of consciousness: awake and alert, oriented and patient cooperative Pain management: pain level controlled Vital Signs Assessment: post-procedure vital signs reviewed and stable Respiratory status: spontaneous breathing, nonlabored ventilation and respiratory function stable Cardiovascular status: blood pressure returned to baseline and stable Postop Assessment: no signs of nausea or vomiting Anesthetic complications: no    Last Vitals:  Filed Vitals:   03/07/16 2357 03/08/16 0142  BP: 126/63 119/77  Pulse: 87 84  Temp: 37.1 C 37.2 C  Resp: 22 20    Last Pain:  Filed Vitals:   03/08/16 0206  PainSc: Asleep                 Gabe Glace,E. Corby [place]

## 2016-03-08 NOTE — Progress Notes (Signed)
Pt arrived to 5C20 from PACU.  Pt is alert and oriented with some drowsiness.  Friends at bedside. Safety measures in place.  Will continue to monitor.  Estanislado EmmsAshley Schwarz, RN

## 2016-03-08 NOTE — Progress Notes (Signed)
Pt ambulated in hallway without difficulty.  Pt states he feels better walking.  Will continue to monitor.   Estanislado EmmsAshley Schwarz, RN

## 2016-03-08 NOTE — Care Management Note (Signed)
Case Management Note  Patient Details  Name: Jose Hernandez MRN: [id number] Date of Birth: 1990/12/02  Subjective/Objective:                    Action/Plan: Patient discharging to home with self care. Pt has no PCP or insurance. CM met with the patient and asked him about the [facility]. Pt was very interested. CM was able to get him an appointment at the [facility] who is seeing overflow for the [facility]. Pt instructed on the use of the pharmacy at discharge for assistance with his meds understanding that the pain medication will not get assistance. Patient verbalized understanding and says that is fine. Will update the bedside RN.   Expected Discharge Date:                  Expected Discharge Plan:  Home/Self Care  In-House Referral:     Discharge planning Services  CM Consult  Post Acute Care Choice:    Choice offered to:     DME Arranged:    DME Agency:     HH Arranged:    [place] Agency:     Status of Service:  Completed, signed off  Medicare Important Message Given:    Date Medicare IM Given:    Medicare IM give by:    Date Additional Medicare IM Given:    Additional Medicare Important Message give by:     If discussed at [place] of Stay Meetings, dates discussed:    Additional Comments:  Pollie Friar, RN 03/08/2016, 10:30 AM

## 2016-03-08 NOTE — Progress Notes (Signed)
[facility] Discharge Summary   Patient ID: Jose BougieDemetri L Dubeau MRN: [id number] DOB/AGE: 26/11/1990 26 y.o.  Admit date: 03/07/2016 Discharge date: 03/08/2016  Admitting Diagnosis: Perirectal abscess Leukocytosis  Discharge Diagnosis Patient Active Problem List   Diagnosis Date Noted  . Perirectal abscess 03/07/2016  . EPISTAXIS, RECURRENT 04/12/2009  . RHINITIS, ALLERGIC 01/17/2007    Consultants None  Imaging: Ct Pelvis W Contrast  03/07/2016  CLINICAL DATA:  Rectal pain for 1 week.  Diarrhea. EXAM: CT PELVIS WITH CONTRAST TECHNIQUE: Multidetector CT imaging of the pelvis was performed using the standard protocol following the bolus administration of intravenous contrast. CONTRAST:  100mL ISOVUE-300 IOPAMIDOL (ISOVUE-300) INJECTION 61% COMPARISON:  None. FINDINGS: Lower urinary tract: Unremarkable Bowel: Accentuated mucosal enhancement in the cecum. Air-fluid level in the rectum compatible with diarrheal process. Perianal fluid collection posteriorly, primarily appears contained by the external sphincter without obvious extension into the ischiorectal fossa, collection measuring 4.4 by 1.2 by 3.1 cm on images 44/2 and 65/4. Vascular/Lymphatic: Unremarkable Reproductive: Right varicocele, venous structure measuring 6 mm in diameter identified. Other: Minimal presacral edema. Musculoskeletal: Unremarkable IMPRESSION: 1. Posterior perianal abscess, 4.4 by 1.2 by 3.1 cm. 2. Air- fluid level in the rectum compatible with diarrheal process. Mildly accentuated mucosal enhancement in the cecum may reflect underlying low-grade colitis. Much of the colon is not included on today' s pelvic CT. 3. Right scrotal varicocele. Electronically Signed   By: Gaylyn RongWalter  Liebkemann M.D.   On: 03/07/2016 18:59    Procedures Dr. Corliss Skainssuei (03/07/16) - Incision and drainage of perirectal abscess, placement of penrose drain  Hospital Course:  25 yo male who presents with a one week history of worsening  perirectal pain and swelling. He came to the ED today and was found to have a posterior perirectal abscess. Too tender for bedside drainage.  Patient was admitted and underwent procedure listed above.  Tolerated procedure well and was transferred to the floor.  Diet was advanced as tolerated.  On POD #1, the patient was voiding well, tolerating diet, ambulating well, pain well controlled, vital signs stable, incisions c/d/i, penrose in place with sutures, and felt stable for discharge home.  Patient will follow up in our office in 2-3 weeks and knows to call with questions or concerns.  He will call to confirm appointment date/time.  The penrose will remain in place.  We discussed dressing changes, showering, and keeping the wound clean with soap and water.  I recommended he take a stool softener and continue with 5 more days of antibiotics upon discharge.  Physical Exam: General:  Alert, NAD, pleasant, comfortable Perirectum:  Perirectal counter incision visible with penrose drain in place.  Foul smell, but wound with adequate drainage.  No areas of fluctuance or induration.  Pain minimal.      Medication List    TAKE these medications        amoxicillin-clavulanate 875-125 MG tablet  Commonly known as:  AUGMENTIN  Take 1 tablet by mouth 2 (two) times daily.     docusate sodium 250 MG capsule  Commonly known as:  COLACE  Take 1 capsule (250 mg total) by mouth 2 (two) times daily as needed for constipation.     naproxen 500 MG tablet  Commonly known as:  NAPROSYN  Take 1 tablet (500 mg total) by mouth 2 (two) times daily with a meal.     oxyCODONE-acetaminophen 5-325 MG tablet  Commonly known as:  PERCOCET/ROXICET  Take 1-2 tablets by mouth every 6 (six) hours  as needed for moderate pain.         Follow-up Information    Follow up with [facility] On 03/16/2016.   Why:  Appointment schedulded at the [facility] on  Thursday 4/27 at [facility] information:   [address] [place] [address] [phone]      Call Wynona Luna., MD.   Specialty:  General Surgery   Why:  For post-operation check.  Call to confirm appointment date/time.   Contact information:   [address] [place] [address] [phone]       Signed: Nonie Hoyer, [facility] [phone]  03/08/2016, 9:14 AM

## 2016-03-09 ENCOUNTER — Encounter (HOSPITAL_COMMUNITY): Payer: Self-pay | Admitting: Emergency Medicine

## 2016-03-09 ENCOUNTER — Emergency Department (HOSPITAL_COMMUNITY)
Admission: EM | Admit: 2016-03-09 | Discharge: 2016-03-09 | Disposition: A | Discharge status: Home / Self Care | Payer: MEDICAID | Attending: Emergency Medicine | Admitting: Emergency Medicine

## 2016-03-09 DIAGNOSIS — K611 Rectal abscess: Secondary | ICD-10-CM

## 2016-03-09 DIAGNOSIS — Z791 Long term (current) use of non-steroidal anti-inflammatories (NSAID): Secondary | ICD-10-CM | POA: Insufficient documentation

## 2016-03-09 DIAGNOSIS — Z9889 Other specified postprocedural states: Secondary | ICD-10-CM | POA: Insufficient documentation

## 2016-03-09 DIAGNOSIS — R197 Diarrhea, unspecified: Secondary | ICD-10-CM | POA: Insufficient documentation

## 2016-03-09 NOTE — ED Notes (Signed)
Patient states he had a rectal abscess removed in surgery x 2 days ago and his drainage tube fell out this morning.   Patient states he did not follow up with the surgeon.

## 2016-03-09 NOTE — Discharge Summary (Signed)
This note was previously mis-categorized as a progress note.    [facility] Discharge Summary   Patient ID: Jose Hernandez MRN: [id number] DOB/AGE: 15-Jul-1991 25 y.o.  Admit date: 03/07/2016 Discharge date: 03/08/2016  Admitting Diagnosis: Perirectal abscess Leukocytosis  Discharge Diagnosis Patient Active Problem List   Diagnosis Date Noted  . Perirectal abscess 03/07/2016  . EPISTAXIS, RECURRENT 04/12/2009  . RHINITIS, ALLERGIC 01/17/2007    Consultants None  Imaging:  Imaging Results (Last 48 hours)    Ct Pelvis W Contrast  03/07/2016 CLINICAL DATA: Rectal pain for 1 week. Diarrhea. EXAM: CT PELVIS WITH CONTRAST TECHNIQUE: Multidetector CT imaging of the pelvis was performed using the standard protocol following the bolus administration of intravenous contrast. CONTRAST: ISOVUE-300 IOPAMIDOL (ISOVUE-300) INJECTION 61% COMPARISON: None. FINDINGS: Lower urinary tract: Unremarkable Bowel: Accentuated mucosal enhancement in the cecum. Air-fluid level in the rectum compatible with diarrheal process. Perianal fluid collection posteriorly, primarily appears contained by the external sphincter without obvious extension into the ischiorectal fossa, collection measuring 4.4 by 1.2 by 3.1 cm on images 44/2 and 65/4. Vascular/Lymphatic: Unremarkable Reproductive: Right varicocele, venous structure measuring 6 mm in diameter identified. Other: Minimal presacral edema. Musculoskeletal: Unremarkable IMPRESSION: 1. Posterior perianal abscess, 4.4 by 1.2 by 3.1 cm. 2. Air- fluid level in the rectum compatible with diarrheal process. Mildly accentuated mucosal enhancement in the cecum may reflect underlying low-grade colitis. Much of the colon is not included on today' s pelvic CT. 3. Right scrotal varicocele. Electronically Signed By: Gaylyn Rong M.D. On: 03/07/2016 18:59     Procedures Dr. Corliss Skains (03/07/16) - Incision and drainage of perirectal  abscess, placement of penrose drain  Hospital Course:  25 yo male who presents with a one week history of worsening perirectal pain and swelling. He came to the ED today and was found to have a posterior perirectal abscess. Too tender for bedside drainage.  Patient was admitted and underwent procedure listed above. Tolerated procedure well and was transferred to the floor. Diet was advanced as tolerated. On POD #1, the patient was voiding well, tolerating diet, ambulating well, pain well controlled, vital signs stable, incisions c/d/i, penrose in place with sutures, and felt stable for discharge home. Patient will follow up in our office in 2-3 weeks and knows to call with questions or concerns. He will call to confirm appointment date/time. The penrose will remain in place. We discussed dressing changes, showering, and keeping the wound clean with soap and water. I recommended he take a stool softener and continue with 5 more days of antibiotics upon discharge.  Physical Exam: General: Alert, NAD, pleasant, comfortable Perirectum: Perirectal counter incision visible with penrose drain in place. Foul smell, but wound with adequate drainage. No areas of fluctuance or induration. Pain minimal.     Medication List    TAKE these medications       amoxicillin-clavulanate 875-125 MG tablet  Commonly known as: AUGMENTIN  Take 1 tablet by mouth 2 (two) times daily.     docusate sodium 250 MG capsule  Commonly known as: COLACE  Take 1 capsule (250 mg total) by mouth 2 (two) times daily as needed for constipation.     naproxen 500 MG tablet  Commonly known as: NAPROSYN  Take 1 tablet (500 mg total) by mouth 2 (two) times daily with a meal.     oxyCODONE-acetaminophen 5-325 MG tablet  Commonly known as: PERCOCET/ROXICET  Take 1-2 tablets by mouth every 6 (six) hours as needed for moderate pain.  Follow-up Information    [facility] On 03/16/2016.   Why: [facility] on Thursday 4/27 at [facility]    Contact information:   [address] [place] [address] [phone]      Call Wynona LunaSUEI,MATTHEW K., MD.   Specialty: General Surgery   Why: For post-operation check. Call to confirm appointment date/time.   Contact information:   [address] [place] [address] [phone]       Signed: Nonie HoyerMegan N. Jream Broyles, [facility] [phone]  03/08/2016, 9:14 AM

## 2016-03-09 NOTE — Discharge Instructions (Signed)
Make sure to take your antibiotics. Sitz baths 3 times a day. Follow up as scheduled next week - Friday with general Surgery.   How to Take a Sitz Bath A sitz bath is a warm water bath that is taken while you are sitting down. The water should only come up to your hips and should cover your buttocks. Your health care provider may recommend a sitz bath to help you:   Clean the lower part of your body, including your genital area.  With itching.  With pain.  With sore muscles or muscles that tighten or spasm. HOW TO TAKE A SITZ BATH Take 3-4 sitz baths per day or as told by your health care provider.  Partially fill a bathtub with warm water. You will only need the water to be deep enough to cover your hips and buttocks when you are sitting in it.  If your health care provider told you to put medicine in the water, follow the directions exactly.  Sit in the water and open the tub drain a little.  Turn on the warm water again to keep the tub at the correct level. Keep the water running constantly.  Soak in the water for 15-20 minutes or as told by your health care provider.  After the sitz bath, pat the affected area dry first. Do not rub it.  Be careful when you stand up after the sitz bath because you may feel dizzy. SEEK MEDICAL CARE IF:  Your symptoms get worse. Do not continue with sitz baths if your symptoms get worse.  You have new symptoms. Do not continue with sitz baths until you talk with your health care provider.   This information is not intended to replace advice given to you by your health care provider. Make sure you discuss any questions you have with your health care provider.   Document Released: 07/29/2004 Document Revised: 03/23/2015 Document Reviewed: 11/04/2014 Elsevier Interactive Patient Education 2016 [employer] Inc[employer]  Perirectal Abscess An abscess is an infected area that contains a collection of pus. A perirectal abscess is an abscess that is near the  opening of the anus or around the rectum. A perirectal abscess can cause a lot of pain, especially during bowel movements. CAUSES This condition is almost always caused by an infection that starts in an anal gland. RISK FACTORS This condition is more likely to develop in:  People with diabetes or inflammatory bowel disease.  People whose body defense system (immune system) is weak.  People who have anal sex.  People who have a sexually transmitted disease (STD).  People who have certain kinds of cancers, such as rectal carcinoma, leukemia, or lymphoma. SYMPTOMS The main symptom of this condition is pain. The pain may be a throbbing pain that gets worse during bowel movements. Other symptoms include:  Fever.  Swelling.  Redness.  Bleeding.  Constipation. DIAGNOSIS The condition is diagnosed with a physical exam. If the abscess is not visible, a health care provider may need to place a finger inside the rectum to find the abscess. Sometimes, imaging tests are done to determine the size and location of the abscess. These tests may include:  An ultrasound.  An MRI.  A CT scan. TREATMENT This condition is usually treated with incision and drainage surgery. Incision and drainage surgery involves making an incision over the abscess to drain the pus. Treatment may also involve antibiotic medicine, pain medicine, stool softeners, or laxatives. HOME CARE INSTRUCTIONS  Take medicines only as directed  by your health care provider.  If you were prescribed an antibiotic, finish all of it even if you start to feel better.  To relieve pain, try sitting:  In a warm, shallow bath (sitz bath).  On a heating pad with the setting on low.  On an inflatable donut-shaped cushion.  Follow any diet instructions as directed by your health care provider.  Keep all follow-up visits as directed by your health care provider. This is important. SEEK MEDICAL CARE IF:  Your abscess is  bleeding.  You have pain, swelling, or redness that is getting worse.  You are constipated.  You feel ill.  You have muscle aches or chills.  You have a fever.  Your symptoms return after the abscess has healed.   This information is not intended to replace advice given to you by your health care provider. Make sure you discuss any questions you have with your health care provider.   Document Released: 11/03/2000 Document Revised: 07/28/2015 Document Reviewed: 09/16/2014 Elsevier Interactive Patient Education [employer]

## 2016-03-09 NOTE — ED Provider Notes (Signed)
CSN: [id number]     Arrival date & time 03/09/16  0829 History   First MD Initiated Contact with Patient 03/09/16 [phone]     Chief Complaint  Patient presents with  . post op problems      (Consider location/radiation/quality/duration/timing/severity/associated sxs/prior Treatment) HPI Teofilo L Voorhies is a 25 y.o. male with recent hospitalization for perianal Abscess, presents to emergency department after seeing his drain in the toilet this morning. Patient was seen 2 days ago and diagnosed with a 4.4 x 2 x 1 cm perianal abscess. It was incised and drained by surgery in OR. Patient was discharged with a Penrose drain the abscess to help with drainage. He was discharged yesterday. Patient states this morning after having a bowel movement she noticed that drain in the toilet. He denies any worsening of pain or any symptoms. He decided to come to the ER to get it looked at.  Past Medical History  Diagnosis Date  . History of epistaxis     years ago   Past Surgical History  Procedure Laterality Date  . Incision and drainage perirectal abscess N/A 03/07/2016    Procedure: IRRIGATION AND DEBRIDEMENT PERIRECTAL ABSCESS;  Surgeon: Manus Rudd, MD;  Location: [facility] OR;  Service: General;  Laterality: N/A;   No family history on file. Social History  Substance Use Topics  . Smoking status: Never Smoker   . Smokeless tobacco: Never Used  . Alcohol Use: 1.8 oz/week    3 Cans of beer per week     Comment: ETOH on board today    Review of Systems  Constitutional: Negative for fever and chills.  Gastrointestinal: Positive for diarrhea and rectal pain. Negative for vomiting and abdominal pain.  Musculoskeletal: Negative for myalgias.  All other systems reviewed and are negative.     Allergies  Review of patient's allergies indicates no known allergies.  Home Medications   Prior to Admission medications   Medication Sig Start Date End Date Taking? Authorizing Provider   amoxicillin-clavulanate (AUGMENTIN) 875-125 MG tablet Take 1 tablet by mouth 2 (two) times daily. 03/08/16 03/15/16  Nonie Hoyer, PA-C  docusate sodium (COLACE) 250 MG capsule Take 1 capsule (250 mg total) by mouth 2 (two) times daily as needed for constipation. 03/08/16   Nonie Hoyer, PA-C  naproxen (NAPROSYN) 500 MG tablet Take 1 tablet (500 mg total) by mouth 2 (two) times daily with a meal. 03/11/15   Garlon Hatchet, PA-C  oxyCODONE-acetaminophen (PERCOCET/ROXICET) 5-325 MG tablet Take 1-2 tablets by mouth every 6 (six) hours as needed for moderate pain. 03/08/16   Megan N Baird, PA-C   BP 175/114 mmHg  Pulse 107  Temp(Src) 98.6 F (37 C) (Oral)  Resp 18  Ht  (1.727 m)  Wt 78.926 kg  BMI 26.46 kg/m2  SpO2 100% Physical Exam  Constitutional: He appears well-developed and well-nourished. No distress.  Cardiovascular: Normal rate, regular rhythm and normal heart sounds.   Pulmonary/Chest: Effort normal and breath sounds normal. No respiratory distress. He has no wheezes. He has no rales.  Genitourinary:  Mild swelling noted to the. Anal area. Tender to palpation over left perianal buttock and posterior to the rectum. No drains identified. No drainage noted. Patient does have some drainage on dressing.  Nursing note and vitals reviewed.   ED Course  Procedures (including critical care time) Labs Review Labs Reviewed - No data to display  Imaging Review Ct Pelvis W Contrast  03/07/2016  CLINICAL DATA:  Rectal pain  for 1 week.  Diarrhea. EXAM: CT PELVIS WITH CONTRAST TECHNIQUE: Multidetector CT imaging of the pelvis was performed using the standard protocol following the bolus administration of intravenous contrast. CONTRAST:  100mL ISOVUE-300 IOPAMIDOL (ISOVUE-300) INJECTION 61% COMPARISON:  None. FINDINGS: Lower urinary tract: Unremarkable Bowel: Accentuated mucosal enhancement in the cecum. Air-fluid level in the rectum compatible with diarrheal process. Perianal fluid collection  posteriorly, primarily appears contained by the external sphincter without obvious extension into the ischiorectal fossa, collection measuring 4.4 by 1.2 by 3.1 cm on images 44/2 and 65/4. Vascular/Lymphatic: Unremarkable Reproductive: Right varicocele, venous structure measuring 6 mm in diameter identified. Other: Minimal presacral edema. Musculoskeletal: Unremarkable IMPRESSION: 1. Posterior perianal abscess, 4.4 by 1.2 by 3.1 cm. 2. Air- fluid level in the rectum compatible with diarrheal process. Mildly accentuated mucosal enhancement in the cecum may reflect underlying low-grade colitis. Much of the colon is not included on today' s pelvic CT. 3. Right scrotal varicocele. Electronically Signed   By: Gaylyn RongWalter  Liebkemann M.D.   On: 03/07/2016 18:59   I have personally reviewed and evaluated these images and lab results as part of my medical decision-making.   EKG Interpretation None      MDM   Final diagnoses:  Perirectal abscess    9:13 AM Pt seen and examined. Pt with perianal abscess incised and drained by surgery 2 days ago, discharged yesterday with penrose drain. Pt in ED because penrose drain fell out. Pt in NAD. States his pain is improved since surgery. Reports diarrhea.   9:42 AM Spoke with general surgery. Patient okay to discharge home with no drain. A she is to do sitz baths 3 times a day and take his antibiotics. He has a follow-up appointment scheduled in 8 days. Return if any worsening symptoms.  Filed Vitals:   03/09/16 0836  BP: 175/114  Pulse: 107  Temp: 98.6 F (37 C)  TempSrc: Oral  Resp: 18  Height: 5\' 8"  (1.727 m)  Weight: 78.926 kg  SpO2: 100%       Jaynie Crumbleatyana Shaunna Rosetti, PA-C 03/09/16 1506  Zadie Rhineonald Wickline, MD 03/10/16 [phone]

## 2016-03-29 ENCOUNTER — Ambulatory Visit: Payer: Self-pay | Admitting: Family Medicine

## 2016-05-04 ENCOUNTER — Encounter (HOSPITAL_COMMUNITY): Payer: Self-pay | Admitting: Emergency Medicine

## 2016-05-04 ENCOUNTER — Emergency Department (HOSPITAL_COMMUNITY)
Admission: EM | Admit: 2016-05-04 | Discharge: 2016-05-04 | Disposition: A | Discharge status: Home / Self Care | Payer: Self-pay | Attending: Emergency Medicine | Admitting: Emergency Medicine

## 2016-05-04 DIAGNOSIS — Z791 Long term (current) use of non-steroidal anti-inflammatories (NSAID): Secondary | ICD-10-CM | POA: Insufficient documentation

## 2016-05-04 DIAGNOSIS — A09 Infectious gastroenteritis and colitis, unspecified: Secondary | ICD-10-CM | POA: Insufficient documentation

## 2016-05-04 HISTORY — DX: Cutaneous abscess, unspecified: L02.91

## 2016-05-04 HISTORY — DX: Anemia, unspecified: D64.9

## 2016-05-04 LAB — COMPREHENSIVE METABOLIC PANEL
ALK PHOS: 71 U/L (ref 38–126)
ALT: 20 U/L (ref 17–63)
ANION GAP: 8 (ref 5–15)
AST: 23 U/L (ref 15–41)
Albumin: 3.9 g/dL (ref 3.5–5.0)
BILIRUBIN TOTAL: 0.5 mg/dL (ref 0.3–1.2)
BUN: 10 mg/dL (ref 6–20)
CALCIUM: 8.9 mg/dL (ref 8.9–10.3)
CO2: 26 mmol/L (ref 22–32)
Chloride: 104 mmol/L (ref 101–111)
Creatinine, Ser: 0.88 mg/dL (ref 0.61–1.24)
GFR calc non Af Amer: >60 mL/min (ref >60)
GLUCOSE: 107 mg/dL — AB (ref 65–99)
POTASSIUM: 3.6 mmol/L (ref 3.5–5.1)
Sodium: 138 mmol/L (ref 135–145)
TOTAL PROTEIN: 7.9 g/dL (ref 6.5–8.1)

## 2016-05-04 LAB — TYPE AND SCREEN
ABO/RH(D): A POS
ANTIBODY SCREEN: NEGATIVE

## 2016-05-04 LAB — CBC
HEMATOCRIT: 36 % — AB (ref 39.0–52.0)
HEMOGLOBIN: 12 g/dL — AB (ref 13.0–17.0)
MCH: 25.2 pg — ABNORMAL LOW (ref 26.0–34.0)
MCHC: 33.3 g/dL (ref 30.0–36.0)
MCV: 75.5 fL — ABNORMAL LOW (ref 78.0–100.0)
Platelets: 389 10*3/uL (ref 150–400)
RBC: 4.77 MIL/uL (ref 4.22–5.81)
RDW: 14.5 % (ref 11.5–15.5)
WBC: 14.7 10*3/uL — AB (ref 4.0–10.5)

## 2016-05-04 LAB — URINALYSIS, ROUTINE W REFLEX MICROSCOPIC
BILIRUBIN URINE: NEGATIVE
Glucose, UA: NEGATIVE mg/dL
Hgb urine dipstick: NEGATIVE
Ketones, ur: NEGATIVE mg/dL
LEUKOCYTES UA: NEGATIVE
NITRITE: NEGATIVE
PH: 6 (ref 5.0–8.0)
Protein, ur: NEGATIVE mg/dL
SPECIFIC GRAVITY, URINE: 1.013 (ref 1.005–1.030)

## 2016-05-04 LAB — LIPASE, BLOOD: Lipase: 21 U/L (ref 11–51)

## 2016-05-04 LAB — ABO/RH: ABO/RH(D): A POS

## 2016-05-04 MED ORDER — DIPHENOXYLATE-ATROPINE 2.5-0.025 MG PO TABS: 1.0000 | ORAL_TABLET | Freq: Four times a day (QID) | ORAL | Status: DC | PRN

## 2016-05-04 MED ORDER — CIPROFLOXACIN HCL 500 MG PO TABS
500.0000 mg | ORAL_TABLET | Freq: Two times a day (BID) | ORAL | Status: DC
  Administered 2016-05-04: 500 mg via ORAL
  Filled 2016-05-04: qty 1

## 2016-05-04 MED ORDER — METRONIDAZOLE 500 MG PO TABS: 500.0000 mg | ORAL_TABLET | Freq: Three times a day (TID) | ORAL | Status: DC

## 2016-05-04 MED ORDER — DIPHENOXYLATE-ATROPINE 2.5-0.025 MG PO TABS
2.0000 | ORAL_TABLET | Freq: Once | ORAL | Status: AC
  Administered 2016-05-04: 2 via ORAL
  Filled 2016-05-04: qty 2

## 2016-05-04 MED ORDER — CIPROFLOXACIN HCL 500 MG PO TABS: 500.0000 mg | ORAL_TABLET | Freq: Two times a day (BID) | ORAL | Status: DC

## 2016-05-04 MED ORDER — METRONIDAZOLE 500 MG PO TABS
500.0000 mg | ORAL_TABLET | Freq: Once | ORAL | Status: AC
  Administered 2016-05-04: 500 mg via ORAL
  Filled 2016-05-04: qty 1

## 2016-05-04 NOTE — ED Notes (Signed)
MD at bedside. 

## 2016-05-04 NOTE — ED Notes (Addendum)
Patient reports that for the past 2 days he has had diarrhea with blood. Denies nausea, vomiting, fever, or abdominal pain. A few days ago patient was constipated and patient took laxatives. 2 days later, patient had diarrhea. Diarrhea ever since. Patient had abscess removed near rectum about 2 months ago.

## 2016-05-04 NOTE — ED Notes (Signed)
Pt cannot use restroom at this time, aware urine specimen is needed.  

## 2016-05-04 NOTE — Discharge Instructions (Signed)
No alcohol with taking Flagyl. Return to ER with worsening bleeding, abdominal pain, fever, or other new or worsening symptoms. GI follow-up blood not resolving within the next 7 days.  Colitis Colitis is inflammation of the colon. Colitis may last a short time (acute) or it may last a long time (chronic). CAUSES This condition may be caused by:  Viruses.  Bacteria.  Reactions to medicine.  Certain autoimmune diseases, such as Crohn disease or ulcerative colitis. SYMPTOMS Symptoms of this condition include:  Diarrhea.  Passing bloody or tarry stool.  Pain.  Fever.  Vomiting.  Tiredness (fatigue).  Weight loss.  Bloating.  Sudden increase in abdominal pain.  Having fewer bowel movements than usual. DIAGNOSIS This condition is diagnosed with a stool test or a blood test. You may also have other tests, including X-rays, a CT scan, or a colonoscopy. TREATMENT Treatment may include:  Resting the bowel. This involves not eating or drinking for a period of time.  Fluids that are given through an IV tube.  Medicine for pain and diarrhea.  Antibiotic medicines.  Cortisone medicines.  Surgery. HOME CARE INSTRUCTIONS Eating and Drinking  Follow instructions from your health care provider about eating or drinking restrictions.  Drink enough fluid to keep your urine clear or pale yellow.  Work with a dietitian to determine which foods cause your condition to flare up.  Avoid foods that cause flare-ups.  Eat a well-balanced diet. Medicines  Take over-the-counter and prescription medicines only as told by your health care provider.  If you were prescribed an antibiotic medicine, take it as told by your health care provider. Do not stop taking the antibiotic even if you start to feel better. General Instructions  Keep all follow-up visits as told by your health care provider. This is important. SEEK MEDICAL CARE IF:  Your symptoms do not go away.  You  develop new symptoms. SEEK IMMEDIATE MEDICAL CARE IF:  You have a fever that does not go away with treatment.  You develop chills.  You have extreme weakness, fainting, or dehydration.  You have repeated vomiting.  You develop severe pain in your abdomen.  You pass bloody or tarry stool.   This information is not intended to replace advice given to you by your health care provider. Make sure you discuss any questions you have with your health care provider.   Document Released: 12/14/2004 Document Revised: 07/28/2015 Document Reviewed: 03/01/2015 Elsevier Interactive Patient Education [employer]2016 [employer] Inc.

## 2016-05-04 NOTE — ED Provider Notes (Signed)
CSN: [id number]     Arrival date & time 05/04/16  1252 History   First MD Initiated Contact with Patient 05/04/16 1441     Chief Complaint  Patient presents with  . Diarrhea  . Rectal Bleeding      HPI  Patient presents for evaluation of diarrhea that has become bloody. Started having diarrhea for 5 days ago. On day 3 started noticing some bright red blood in his stools. He has no perirectal pain. He has no abdominal pain. He had a incision and drainage of perirectal abscess a few months ago and has done well from that. No recent antibiotic use. No travel. No ill exposures.  Past Medical History  Diagnosis Date  . History of epistaxis     years ago  . Abscess   . Anemia    Past Surgical History  Procedure Laterality Date  . Incision and drainage perirectal abscess N/A 03/07/2016    Procedure: IRRIGATION AND DEBRIDEMENT PERIRECTAL ABSCESS;  Surgeon: Manus Rudd, MD;  Location: [facility] OR;  Service: General;  Laterality: N/A;   History reviewed. No pertinent family history. Social History  Substance Use Topics  . Smoking status: Never Smoker   . Smokeless tobacco: Never Used  . Alcohol Use: 1.8 oz/week    3 Cans of beer per week     Comment: ETOH on board today    Review of Systems  Constitutional: Negative for fever, chills, diaphoresis, appetite change and fatigue.  HENT: Negative for mouth sores, sore throat and trouble swallowing.   Eyes: Negative for visual disturbance.  Respiratory: Negative for cough, chest tightness, shortness of breath and wheezing.   Cardiovascular: Negative for chest pain.  Gastrointestinal: Positive for diarrhea and blood in stool. Negative for nausea, vomiting, abdominal pain and abdominal distention.  Endocrine: Negative for polydipsia, polyphagia and polyuria.  Genitourinary: Negative for dysuria, frequency and hematuria.  Musculoskeletal: Negative for gait problem.  Skin: Negative for color change, pallor and rash.  Neurological: Negative for  dizziness, syncope, light-headedness and headaches.  Hematological: Does not bruise/bleed easily.  Psychiatric/Behavioral: Negative for behavioral problems and confusion.      Allergies  Review of patient's allergies indicates no known allergies.  Home Medications   Prior to Admission medications   Medication Sig Start Date End Date Taking? Authorizing Provider  ibuprofen (ADVIL,MOTRIN) 200 MG tablet Take 400 mg by mouth every 6 (six) hours as needed for headache.   Yes Historical Provider, MD  ciprofloxacin (CIPRO) 500 MG tablet Take 1 tablet (500 mg total) by mouth 2 (two) times daily. 05/04/16   Rolland Porter, MD  diphenoxylate-atropine (LOMOTIL) 2.5-0.025 MG tablet Take 1 tablet by mouth 4 (four) times daily as needed for diarrhea or loose stools. 05/04/16   Rolland Porter, MD  docusate sodium (COLACE) 250 MG capsule Take 1 capsule (250 mg total) by mouth 2 (two) times daily as needed for constipation. Patient not taking: Reported on 05/04/2016 03/08/16   Nonie Hoyer, PA-C  metroNIDAZOLE (FLAGYL) 500 MG tablet Take 1 tablet (500 mg total) by mouth 3 (three) times daily. 05/04/16   Rolland Porter, MD  naproxen (NAPROSYN) 500 MG tablet Take 1 tablet (500 mg total) by mouth 2 (two) times daily with a meal. Patient not taking: Reported on 05/04/2016 03/11/15   Garlon Hatchet, PA-C  oxyCODONE-acetaminophen (PERCOCET/ROXICET) 5-325 MG tablet Take 1-2 tablets by mouth every 6 (six) hours as needed for moderate pain. Patient not taking: Reported on 05/04/2016 03/08/16   Nonie Hoyer, PA-C  BP 140/93 mmHg  Pulse 101  Temp(Src) 98.6 F (37 C) (Oral)  Resp 18  Ht 5\' 8"  (1.727 m)  Wt 175 lb (79.379 kg)  BMI 26.61 kg/m2  SpO2 97% Physical Exam  Constitutional: He is oriented to person, place, and time. He appears well-developed and well-nourished. No distress.  HENT:  Head: Normocephalic.  Eyes: Conjunctivae are normal. Pupils are equal, round, and reactive to light. No scleral icterus.  Neck: Normal  range of motion. Neck supple. No thyromegaly present.  Cardiovascular: Normal rate and regular rhythm.  Exam reveals no gallop and no friction rub.   No murmur heard. Pulmonary/Chest: Effort normal and breath sounds normal. No respiratory distress. He has no wheezes. He has no rales.  Abdominal: Soft. Bowel sounds are normal. He exhibits no distension. There is no tenderness. There is no rebound.  Abdomen is soft and benign. No tenderness. No perirectal disease. No sign of abscess formation. No fissures or external hemorrhoids.  Musculoskeletal: Normal range of motion.  Neurological: He is alert and oriented to person, place, and time.  Skin: Skin is warm and dry. No rash noted.  Psychiatric: He has a normal mood and affect. His behavior is normal.    ED Course  Procedures (including critical care time) Labs Review Labs Reviewed  COMPREHENSIVE METABOLIC PANEL - Abnormal; Notable for the following:    Glucose, Bld 107 (*)    All other components within normal limits  CBC - Abnormal; Notable for the following:    WBC 14.7 (*)    Hemoglobin 12.0 (*)    HCT 36.0 (*)    MCV 75.5 (*)    MCH 25.2 (*)    All other components within normal limits  LIPASE, BLOOD  URINALYSIS, ROUTINE W REFLEX MICROSCOPIC (NOT AT [facility])  POC OCCULT BLOOD, ED  TYPE AND SCREEN  ABO/RH    Imaging Review No results found. I have personally reviewed and evaluated these images and lab results as part of my medical decision-making.   EKG Interpretation None      MDM   Final diagnoses:  Infectious colitis    Patient with normal vitals. Nontoxic. No pain. I'll treat for infectious colitis as this started with diarrhea that has become bloody. Leukocytosis. WBC slightly low. Marland Kitchen. No AKI .    Rolland PorterMark Madeliene Tejera, MD 05/04/16 [phone]

## 2016-11-07 ENCOUNTER — Encounter (HOSPITAL_COMMUNITY): Payer: Self-pay

## 2016-11-07 ENCOUNTER — Emergency Department (HOSPITAL_COMMUNITY)
Admission: EM | Admit: 2016-11-07 | Discharge: 2016-11-07 | Disposition: A | Discharge status: Home / Self Care | Payer: Self-pay | Attending: Emergency Medicine | Admitting: Emergency Medicine

## 2016-11-07 DIAGNOSIS — F17200 Nicotine dependence, unspecified, uncomplicated: Secondary | ICD-10-CM | POA: Insufficient documentation

## 2016-11-07 DIAGNOSIS — J029 Acute pharyngitis, unspecified: Secondary | ICD-10-CM | POA: Insufficient documentation

## 2016-11-07 DIAGNOSIS — B349 Viral infection, unspecified: Secondary | ICD-10-CM | POA: Insufficient documentation

## 2016-11-07 LAB — RAPID STREP SCREEN (MED CTR MEBANE ONLY): STREPTOCOCCUS, GROUP A SCREEN (DIRECT): NEGATIVE

## 2016-11-07 MED ORDER — DEXAMETHASONE SODIUM PHOSPHATE 10 MG/ML IJ SOLN
10.0000 mg | Freq: Once | INTRAMUSCULAR | Status: AC
  Administered 2016-11-07: 10 mg via INTRAMUSCULAR
  Filled 2016-11-07: qty 1

## 2016-11-07 MED ORDER — IBUPROFEN 400 MG PO TABS
600.0000 mg | ORAL_TABLET | Freq: Once | ORAL | Status: AC
  Administered 2016-11-07: 600 mg via ORAL
  Filled 2016-11-07: qty 1

## 2016-11-07 MED ORDER — IBUPROFEN 600 MG PO TABS: 600.0000 mg | ORAL_TABLET | Freq: Four times a day (QID) | ORAL | 0 refills | Status: DC | PRN

## 2016-11-07 MED ORDER — MAGIC MOUTHWASH W/LIDOCAINE: 5.0000 mL | Freq: Three times a day (TID) | ORAL | 0 refills | Status: DC | PRN

## 2016-11-07 NOTE — Discharge Planning (Signed)
Pt up for discharge. EDCM reviewed chart for possible CM needs.  No needs identified or communicated.  

## 2016-11-07 NOTE — ED Notes (Signed)
Pt. Able to tolerate fluids.  

## 2016-11-07 NOTE — ED Notes (Signed)
Pt. Left room to go see his girlfriend. Pt. Directed back to his room by RN at this time.

## 2016-11-07 NOTE — ED Notes (Signed)
Patient d/c'd from continuous pulse oximetry and blood pressure cuff; patient getting dressed to be discharged  home 

## 2016-11-07 NOTE — ED Provider Notes (Addendum)
[facility]-EMERGENCY DEPT Provider Note   CSN: [id number] Arrival date & time: 11/07/16  0820     History   Chief Complaint Chief Complaint  Patient presents with  . Shortness of Breath    HPI Jose Hernandez is a 25 y.o. male.  HPI Pt comes in with cc of cough, sore throat, weakness. Pt has no medical hx. Pt reports waking up today with sore throat and weakness. Pt has a cough, producing yellow phlegm. Pt is having subjective fevers. No hx of asthma.  Past Medical History:  Diagnosis Date  . Abscess   . Anemia   . History of epistaxis    years ago    Patient Active Problem List   Diagnosis Date Noted  . Perirectal abscess 03/07/2016  . EPISTAXIS, RECURRENT 04/12/2009  . RHINITIS, ALLERGIC 01/17/2007    Past Surgical History:  Procedure Laterality Date  . INCISION AND DRAINAGE PERIRECTAL ABSCESS N/A 03/07/2016   Procedure: IRRIGATION AND DEBRIDEMENT PERIRECTAL ABSCESS;  Surgeon: Manus RuddMatthew Tsuei, MD;  Location: [facility] OR;  Service: General;  Laterality: N/A;       Home Medications    Prior to Admission medications   Medication Sig Start Date End Date Taking? Authorizing Provider  ciprofloxacin (CIPRO) 500 MG tablet Take 1 tablet (500 mg total) by mouth 2 (two) times daily. 05/04/16   Rolland PorterMark James, MD  diphenoxylate-atropine (LOMOTIL) 2.5-0.025 MG tablet Take 1 tablet by mouth 4 (four) times daily as needed for diarrhea or loose stools. 05/04/16   Rolland PorterMark James, MD  docusate sodium (COLACE) 250 MG capsule Take 1 capsule (250 mg total) by mouth 2 (two) times daily as needed for constipation. Patient not taking: Reported on 05/04/2016 03/08/16   Nonie HoyerMegan N Baird, PA-C  ibuprofen (ADVIL,MOTRIN) 600 MG tablet Take 1 tablet (600 mg total) by mouth every 6 (six) hours as needed. 11/07/16   Jose KaplanAnkit Stacie Templin, MD  magic mouthwash w/lidocaine SOLN Take 5 mLs by mouth 3 (three) times daily as needed for mouth pain. 11/07/16   Jose KaplanAnkit Brandun Pinn, MD  metroNIDAZOLE (FLAGYL) 500 MG tablet Take 1 tablet  (500 mg total) by mouth 3 (three) times daily. 05/04/16   Rolland PorterMark James, MD  naproxen (NAPROSYN) 500 MG tablet Take 1 tablet (500 mg total) by mouth 2 (two) times daily with a meal. Patient not taking: Reported on 05/04/2016 03/11/15   Garlon HatchetLisa M Sanders, PA-C  oxyCODONE-acetaminophen (PERCOCET/ROXICET) 5-325 MG tablet Take 1-2 tablets by mouth every 6 (six) hours as needed for moderate pain. Patient not taking: Reported on 05/04/2016 03/08/16   Nonie HoyerMegan N Baird, PA-C    Family History History reviewed. No pertinent family history.  Social History Social History  Substance Use Topics  . Smoking status: Current Some Day Smoker  . Smokeless tobacco: Never Used  . Alcohol use 1.8 oz/week    3 Cans of beer per week     Comment: ETOH on board today     Allergies   Patient has no known allergies.   Review of Systems Review of Systems  ROS 10 Systems reviewed and are negative for acute change except as noted in the HPI.     Physical Exam Updated Vital Signs BP 135/88 (BP Location: Right Arm)   Pulse 95   Temp 97.8 F (36.6 C) (Oral)   Resp 17   Ht 5\' 9"  (1.753 m)   Wt 170 lb (77.1 kg)   SpO2 99%   BMI 25.10 kg/m   Physical Exam  Constitutional: He is oriented to  person, place, and time. He appears well-developed.  HENT:  Head: Atraumatic.  Mouth/Throat: Oropharyngeal exudate present.  R sided tonsillar swelling  Neck: Neck supple.  Cardiovascular: Normal rate.   Pulmonary/Chest: Effort normal. No respiratory distress. He has no wheezes.  Lymphadenopathy:    He has cervical adenopathy.  Neurological: He is alert and oriented to person, place, and time.  Skin: Skin is warm.  Nursing note and vitals reviewed.    ED Treatments / Results  Labs (all labs ordered are listed, but only abnormal results are displayed) Labs Reviewed  RAPID STREP SCREEN (NOT AT [facility])  CULTURE, GROUP A STREP [facility])    EKG  EKG Interpretation None       Radiology No results  found.  Procedures Procedures (including critical care time)  Medications Ordered in ED Medications  dexamethasone (DECADRON) injection 10 mg (10 mg Intramuscular Given 11/07/16 1018)  ibuprofen (ADVIL,MOTRIN) tablet 600 mg (600 mg Oral Given 11/07/16 1018)     Initial Impression / Assessment and Plan / ED Course  I have reviewed the triage vital signs and the nursing notes.  Pertinent labs & imaging results that were available during my care of the patient were reviewed by me and considered in my medical decision making (see chart for details).  Clinical Course    PT comes in with cc of sore throat, cough, weakness. PT has normal vitals here. Suspect flu like symptoms. Pt has normal lung exam. He does have R sided tonsillar enlargement. Rapid strep ordered - if neg, will tx as viral syndrome.  Final Clinical Impressions(s) / ED Diagnoses   Final diagnoses:  Pharyngitis with viral syndrome    New Prescriptions New Prescriptions   IBUPROFEN (ADVIL,MOTRIN) 600 MG TABLET    Take 1 tablet (600 mg total) by mouth every 6 (six) hours as needed.   MAGIC MOUTHWASH W/LIDOCAINE SOLN    Take 5 mLs by mouth 3 (three) times daily as needed for mouth pain.     Jose KaplanAnkit Jodye Scali, MD 11/07/16 1031    Jose KaplanAnkit Trenten Watchman, MD 11/07/16 1042

## 2016-11-07 NOTE — ED Triage Notes (Signed)
Pt. Here for shortness of breath starting yesterday. Pt. sts he thinks he has pneumonia because he has been walking outside with no shoes on. Pt. Endorses a strong dry cough. Pt. Oxygen saturation 99% on room air.

## 2016-11-07 NOTE — Discharge Instructions (Signed)
We think what you have is a viral syndrome - the treatment for which is symptomatic relief only, and your body will fight the infection off in a few days. We are prescribing you some meds for pain and fevers. See your primary care doctor in 1 week if the symptoms dont improve. Return to the ER if the symptoms are getting worse.

## 2016-11-09 LAB — CULTURE, GROUP A STREP (THRC)

## 2017-12-24 ENCOUNTER — Other Ambulatory Visit: Payer: Self-pay

## 2017-12-24 ENCOUNTER — Encounter (HOSPITAL_COMMUNITY): Payer: Self-pay

## 2017-12-24 DIAGNOSIS — Z79899 Other long term (current) drug therapy: Secondary | ICD-10-CM | POA: Insufficient documentation

## 2017-12-24 DIAGNOSIS — K610 Anal abscess: Secondary | ICD-10-CM | POA: Insufficient documentation

## 2017-12-24 DIAGNOSIS — F17200 Nicotine dependence, unspecified, uncomplicated: Secondary | ICD-10-CM | POA: Insufficient documentation

## 2017-12-24 NOTE — ED Triage Notes (Signed)
Per Pt, Pt is coming from home with rectal pain and abscess to the rectum. Hx of the same. Denies any fevers.

## 2017-12-25 ENCOUNTER — Emergency Department (HOSPITAL_COMMUNITY)
Admission: EM | Admit: 2017-12-25 | Discharge: 2017-12-25 | Disposition: A | Discharge status: Home / Self Care | Payer: Self-pay | Attending: Emergency Medicine | Admitting: Emergency Medicine

## 2017-12-25 ENCOUNTER — Emergency Department (HOSPITAL_COMMUNITY): Payer: Self-pay

## 2017-12-25 DIAGNOSIS — K610 Anal abscess: Secondary | ICD-10-CM

## 2017-12-25 LAB — BASIC METABOLIC PANEL
ANION GAP: 11 (ref 5–15)
BUN: 12 mg/dL (ref 6–20)
CHLORIDE: 102 mmol/L (ref 101–111)
CO2: 23 mmol/L (ref 22–32)
Calcium: 9.1 mg/dL (ref 8.9–10.3)
Creatinine, Ser: 0.94 mg/dL (ref 0.61–1.24)
GFR calc Af Amer: >60 mL/min (ref >60)
GLUCOSE: 89 mg/dL (ref 65–99)
Potassium: 3.9 mmol/L (ref 3.5–5.1)
SODIUM: 136 mmol/L (ref 135–145)

## 2017-12-25 LAB — CBC WITH DIFFERENTIAL/PLATELET
BASOS ABS: 0 10*3/uL (ref 0.0–0.1)
Basophils Relative: 0 %
EOS PCT: 1 %
Eosinophils Absolute: 0.1 10*3/uL (ref 0.0–0.7)
HCT: 37.2 % — ABNORMAL LOW (ref 39.0–52.0)
HEMOGLOBIN: 11.9 g/dL — AB (ref 13.0–17.0)
LYMPHS ABS: 1.9 10*3/uL (ref 0.7–4.0)
LYMPHS PCT: 23 %
MCH: 24.8 pg — ABNORMAL LOW (ref 26.0–34.0)
MCHC: 32 g/dL (ref 30.0–36.0)
MCV: 77.7 fL — AB (ref 78.0–100.0)
Monocytes Absolute: 0.9 10*3/uL (ref 0.1–1.0)
Monocytes Relative: 11 %
NEUTROS ABS: 5.5 10*3/uL (ref 1.7–7.7)
NEUTROS PCT: 65 %
PLATELETS: 232 10*3/uL (ref 150–400)
RBC: 4.79 MIL/uL (ref 4.22–5.81)
RDW: 14.6 % (ref 11.5–15.5)
WBC: 8.4 10*3/uL (ref 4.0–10.5)

## 2017-12-25 MED ORDER — OXYCODONE-ACETAMINOPHEN 5-325 MG PO TABS
1.0000 | ORAL_TABLET | Freq: Once | ORAL | Status: AC
  Administered 2017-12-25: 1 via ORAL
  Filled 2017-12-25: qty 1

## 2017-12-25 MED ORDER — IOPAMIDOL (ISOVUE-300) INJECTION 61%
INTRAVENOUS | Status: AC
  Administered 2017-12-25: 100 mL
  Filled 2017-12-25: qty 100

## 2017-12-25 MED ORDER — HYDROCODONE-ACETAMINOPHEN 5-325 MG PO TABS: 1.0000 | ORAL_TABLET | Freq: Four times a day (QID) | ORAL | 0 refills | Status: DC | PRN

## 2017-12-25 NOTE — ED Provider Notes (Signed)
[facility] EMERGENCY DEPARTMENT Provider Note   CSN: [id number] Arrival date & time: 12/24/17  1704     History   Chief Complaint Chief Complaint  Patient presents with  . Abscess    HPI Jose Hernandez is a 27 y.o. male presenting to ED for peri-anal abscess that has been worsening x3 days. Pt w hx of perianal abscess requiring surgical draining in April 2017. Pt denies hx of GI disorder or immunocompromise. Denies F/C, N/V, or any other complaints. Thinks abscess may have begun to drain while waiting in ED.   The history is provided by the patient.    Past Medical History:  Diagnosis Date  . Abscess   . Anemia   . History of epistaxis    years ago    Patient Active Problem List   Diagnosis Date Noted  . Perirectal abscess 03/07/2016  . EPISTAXIS, RECURRENT 04/12/2009  . RHINITIS, ALLERGIC 01/17/2007    Past Surgical History:  Procedure Laterality Date  . INCISION AND DRAINAGE PERIRECTAL ABSCESS N/A 03/07/2016   Procedure: IRRIGATION AND DEBRIDEMENT PERIRECTAL ABSCESS;  Surgeon: Manus Rudd, MD;  Location: [facility] OR;  Service: General;  Laterality: N/A;       Home Medications    Prior to Admission medications   Medication Sig Start Date End Date Taking? Authorizing Provider  ciprofloxacin (CIPRO) 500 MG tablet Take 1 tablet (500 mg total) by mouth 2 (two) times daily. 05/04/16   Rolland Porter, MD  diphenoxylate-atropine (LOMOTIL) 2.5-0.025 MG tablet Take 1 tablet by mouth 4 (four) times daily as needed for diarrhea or loose stools. 05/04/16   Rolland Porter, MD  docusate sodium (COLACE) 250 MG capsule Take 1 capsule (250 mg total) by mouth 2 (two) times daily as needed for constipation. Patient not taking: Reported on 05/04/2016 03/08/16   Nonie Hoyer, PA-C  HYDROcodone-acetaminophen (NORCO/VICODIN) 5-325 MG tablet Take 1-2 tablets by mouth every 6 (six) hours as needed for moderate pain or severe pain. 12/25/17   Robinson, [place] N, PA-C  ibuprofen  (ADVIL,MOTRIN) 600 MG tablet Take 1 tablet (600 mg total) by mouth every 6 (six) hours as needed. 11/07/16   Derwood Kaplan, MD  magic mouthwash w/lidocaine SOLN Take 5 mLs by mouth 3 (three) times daily as needed for mouth pain. 11/07/16   Derwood Kaplan, MD  metroNIDAZOLE (FLAGYL) 500 MG tablet Take 1 tablet (500 mg total) by mouth 3 (three) times daily. 05/04/16   Rolland Porter, MD  naproxen (NAPROSYN) 500 MG tablet Take 1 tablet (500 mg total) by mouth 2 (two) times daily with a meal. Patient not taking: Reported on 05/04/2016 03/11/15   Garlon Hatchet, PA-C  oxyCODONE-acetaminophen (PERCOCET/ROXICET) 5-325 MG tablet Take 1-2 tablets by mouth every 6 (six) hours as needed for moderate pain. Patient not taking: Reported on 05/04/2016 03/08/16   Nonie Hoyer, PA-C    Family History No family history on file.  Social History Social History   Tobacco Use  . Smoking status: Current Some Day Smoker  . Smokeless tobacco: Never Used  Substance Use Topics  . Alcohol use: Yes    Alcohol/week: 1.8 oz    Types: 3 Cans of beer per week    Comment: ETOH on board today  . Drug use: No     Allergies   Patient has no known allergies.   Review of Systems Review of Systems  Constitutional: Negative for chills and fever.  Gastrointestinal: Positive for rectal pain (abscess). Negative for abdominal pain  and nausea.     Physical Exam Updated Vital Signs BP (!) 159/92 (BP Location: Right Arm)   Pulse 79   Temp 98 F (36.7 C) (Oral)   Resp 18   Ht 5\' 9"  (1.753 m)   Wt 72.6 kg (160 lb)   SpO2 100%   BMI 23.63 kg/m   Physical Exam  Constitutional: He appears well-developed and well-nourished. No distress.  HENT:  Head: Normocephalic and atraumatic.  Eyes: Conjunctivae are normal.  Cardiovascular: Normal rate and intact distal pulses.  Pulmonary/Chest: Effort normal.  Abdominal: Soft. Bowel sounds are normal. There is no tenderness.  Genitourinary:     Genitourinary Comments:  Exam performed with chaperone present. Actively draining tender fluctuant abscess to area about 1-2cm from anus. Surrounding induration noted.  Rectal exam without internal tenderness.  Neurological: He is alert.  Skin: Skin is warm.  Psychiatric: He has a normal mood and affect. His behavior is normal.  Nursing note and vitals reviewed.    ED Treatments / Results  Labs (all labs ordered are listed, but only abnormal results are displayed) Labs Reviewed  CBC WITH DIFFERENTIAL/PLATELET - Abnormal; Notable for the following components:      Result Value   Hemoglobin 11.9 (*)    HCT 37.2 (*)    MCV 77.7 (*)    MCH 24.8 (*)    All other components within normal limits  BASIC METABOLIC PANEL    EKG  EKG Interpretation None       Radiology Ct Pelvis W Contrast  Result Date: 12/25/2017 CLINICAL DATA:  Acute onset of rectal pain. Personal history of rectal abscess. EXAM: CT PELVIS WITH CONTRAST TECHNIQUE: Multidetector CT imaging of the pelvis was performed using the standard protocol following the bolus administration of intravenous contrast. CONTRAST:  ISOVUE-300 IOPAMIDOL (ISOVUE-300) INJECTION 61% COMPARISON:  CT of the pelvis performed 03/07/2016 FINDINGS: Urinary Tract: The bladder is mildly distended and grossly unremarkable. Bowel: Visualized small and large bowel loops are grossly unremarkable. Vascular/Lymphatic: The visualized vasculature is grossly unremarkable. No retroperitoneal or pelvic sidewall lymphadenopathy is seen. Reproductive:  The prostate is normal in size. Other: Mild soft tissue inflammation is noted tracking about the inferior anorectal canal, extending along the gluteal cleft, slightly more prominent on the right. There is trace edema noted to the left of the anorectal canal, without definite evidence of a drainable abscess. Musculoskeletal: No acute osseous abnormalities are identified. The visualized musculature is unremarkable in appearance. IMPRESSION:  1. No definite evidence of drainable abscess. 2. Mild soft tissue inflammation about the inferior anorectal canal, extending along the gluteal cleft, slightly more prominent on the right. Trace edema to the left of the anorectal canal. Electronically Signed   By: Roanna Raider M.D.   On: 12/25/2017 06:00    Procedures Procedures (including critical care time)  Medications Ordered in ED Medications  oxyCODONE-acetaminophen (PERCOCET/ROXICET) 5-325 MG per tablet 1 tablet (not administered)  iopamidol (ISOVUE-300) 61 % injection (100 mLs  Contrast Given 12/25/17 0534)     Initial Impression / Assessment and Plan / ED Course  I have reviewed the triage vital signs and the nursing notes.  Pertinent labs & imaging results that were available during my care of the patient were reviewed by me and considered in my medical decision making (see chart for details).     Pt w actively draining peri-anal abscess. Afebrile, not in distress. No significant signs of surrounding cellulitis. Hx of requiring surgical drainage. Pt discussed w surgery, Dr. Dwain Sarna,  who recommends CT. CT neg for evidence of deep space infection. No leukocytosis. Abscess actively draining in ED. Pt discussed with Dr. Preston FleetingGlick; pt is safe for discharge with symptomatic management and outpatient surgery follow up. Discussed importance of warm compresses and sitz baths to aid in continued drainage.   [place] Controlled Substance reporting System queried  Discussed results, findings, treatment and follow up. Patient advised of return precautions. Patient verbalized understanding and agreed with plan.   Final Clinical Impressions(s) / ED Diagnoses   Final diagnoses:  Perianal abscess    ED Discharge Orders        Ordered    HYDROcodone-acetaminophen (NORCO/VICODIN) 5-325 MG tablet  Every 6 hours PRN     12/25/17 0619       Robinson, [place] N, PA-C 12/25/17 [id number]    Dione BoozeGlick, David, MD 12/25/17 [phone]

## 2017-12-25 NOTE — Discharge Instructions (Addendum)
Please read instructions below.  Keep your wound clean. Soak/flush your wound with warm water, multiple times per day. You can sit in a warm bath multiple times per day, or you can purchase a "sitz bath" from the pharmacy.  You can take hydrocodone every 6 hours as needed for pain. Do not drive, drink alcohol, or take tylenol while taking this medication. You can take advil/ibuprofen with this medication for added pain relief. Schedule an appointment with the surgeon's office in 3 days for recheck and follow up. Return to the ER for fever, worsening redness, or new or worsening symptoms.

## 2017-12-25 NOTE — ED Notes (Signed)
Patient transported to CT 

## 2018-05-31 ENCOUNTER — Ambulatory Visit (HOSPITAL_COMMUNITY)
Admission: EM | Admit: 2018-05-31 | Discharge: 2018-05-31 | Disposition: A | Discharge status: Home / Self Care | Payer: Self-pay | Attending: Family Medicine | Admitting: Family Medicine

## 2018-05-31 ENCOUNTER — Encounter (HOSPITAL_COMMUNITY): Payer: Self-pay | Admitting: Emergency Medicine

## 2018-05-31 DIAGNOSIS — Z792 Long term (current) use of antibiotics: Secondary | ICD-10-CM | POA: Insufficient documentation

## 2018-05-31 DIAGNOSIS — Z791 Long term (current) use of non-steroidal anti-inflammatories (NSAID): Secondary | ICD-10-CM | POA: Insufficient documentation

## 2018-05-31 DIAGNOSIS — Z9889 Other specified postprocedural states: Secondary | ICD-10-CM | POA: Insufficient documentation

## 2018-05-31 DIAGNOSIS — F17200 Nicotine dependence, unspecified, uncomplicated: Secondary | ICD-10-CM | POA: Insufficient documentation

## 2018-05-31 DIAGNOSIS — R030 Elevated blood-pressure reading, without diagnosis of hypertension: Secondary | ICD-10-CM

## 2018-05-31 DIAGNOSIS — Z79899 Other long term (current) drug therapy: Secondary | ICD-10-CM | POA: Insufficient documentation

## 2018-05-31 DIAGNOSIS — R509 Fever, unspecified: Secondary | ICD-10-CM | POA: Insufficient documentation

## 2018-05-31 DIAGNOSIS — J029 Acute pharyngitis, unspecified: Secondary | ICD-10-CM

## 2018-05-31 DIAGNOSIS — H9203 Otalgia, bilateral: Secondary | ICD-10-CM

## 2018-05-31 DIAGNOSIS — H9209 Otalgia, unspecified ear: Secondary | ICD-10-CM

## 2018-05-31 DIAGNOSIS — Z79891 Long term (current) use of opiate analgesic: Secondary | ICD-10-CM | POA: Insufficient documentation

## 2018-05-31 LAB — POCT I-STAT, CHEM 8
BUN: 8 mg/dL (ref 6–20)
CALCIUM ION: 1.25 mmol/L (ref 1.15–1.40)
CREATININE: 1.1 mg/dL (ref 0.61–1.24)
Chloride: 100 mmol/L (ref 98–111)
Glucose, Bld: 93 mg/dL (ref 70–99)
HCT: 46 % (ref 39.0–52.0)
HEMOGLOBIN: 15.6 g/dL (ref 13.0–17.0)
Potassium: 4.2 mmol/L (ref 3.5–5.1)
SODIUM: 134 mmol/L — AB (ref 135–145)
TCO2: 23 mmol/L (ref 22–32)

## 2018-05-31 LAB — POCT RAPID STREP A: Streptococcus, Group A Screen (Direct): NEGATIVE

## 2018-05-31 MED ORDER — ACETAMINOPHEN 325 MG PO TABS
ORAL_TABLET | ORAL | Status: AC
  Filled 2018-05-31: qty 2

## 2018-05-31 MED ORDER — ACETAMINOPHEN 325 MG PO TABS
650.0000 mg | ORAL_TABLET | Freq: Once | ORAL | Status: AC
  Administered 2018-05-31: 650 mg via ORAL

## 2018-05-31 MED ORDER — AZITHROMYCIN 250 MG PO TABS: 250.0000 mg | ORAL_TABLET | Freq: Every day | ORAL | 0 refills | Status: DC

## 2018-05-31 NOTE — ED Notes (Signed)
Last tylenol at 9am

## 2018-05-31 NOTE — ED Provider Notes (Addendum)
[facility]    CSN: [id number] Arrival date & time: 05/31/18  1643     History   Chief Complaint Chief Complaint  Patient presents with  . Fever    HPI Jose Hernandez is a 27 y.o. male.   The history is provided by the patient. No language interpreter was used.  Fever  Max temp prior to arrival:  103.2 Temp source:  Oral Onset quality:  Gradual Duration:  1 day Timing:  Intermittent Progression:  Waxing and waning Chronicity:  New Relieved by:  Acetaminophen (Keep well hydrated and rest) Worsened by:  Nothing Associated symptoms: chills, ear pain, headaches, myalgias, nausea and sore throat   Associated symptoms: no confusion, no congestion, no cough, no diarrhea, no dysuria and no vomiting   Associated symptoms comment:  Vision changes. Body aches, feels chilly.  Risk factors: sick contacts   Risk factors: no contaminated food and no contaminated water   Risk factors comment:  Room mate daughter had a temp of 1007 and was told she had strep throat and one of her other daughter just had some stomach bug Sore Throat  This is a new problem. The current episode started yesterday. The problem occurs constantly. The problem has not changed since onset.Associated symptoms include headaches. Nothing aggravates the symptoms. The symptoms are relieved by acetaminophen. He has tried acetaminophen for the symptoms. The treatment provided moderate relief.  Otalgia  Location:  Bilateral Behind ear:  No abnormality Quality:  Aching Onset quality:  Sudden Duration:  1 day Timing:  Constant Progression:  Unchanged Chronicity:  New Worsened by:  Nothing Associated symptoms: fever, headaches and sore throat   Associated symptoms: no congestion, no cough, no diarrhea and no vomiting     Past Medical History:  Diagnosis Date  . Abscess   . Anemia   . History of epistaxis    years ago    Patient Active Problem List   Diagnosis Date Noted  . Perirectal abscess  03/07/2016  . EPISTAXIS, RECURRENT 04/12/2009  . RHINITIS, ALLERGIC 01/17/2007    Past Surgical History:  Procedure Laterality Date  . INCISION AND DRAINAGE PERIRECTAL ABSCESS N/A 03/07/2016   Procedure: IRRIGATION AND DEBRIDEMENT PERIRECTAL ABSCESS;  Surgeon: Manus Rudd, MD;  Location: [facility] OR;  Service: General;  Laterality: N/A;    Repeat BP : 119/82 Temp: 101.1   Home Medications    Prior to Admission medications   Medication Sig Start Date End Date Taking? Authorizing Provider  ciprofloxacin (CIPRO) 500 MG tablet Take 1 tablet (500 mg total) by mouth 2 (two) times daily. Patient not taking: Reported on 05/31/2018 05/04/16   Rolland Porter, MD  diphenoxylate-atropine (LOMOTIL) 2.5-0.025 MG tablet Take 1 tablet by mouth 4 (four) times daily as needed for diarrhea or loose stools. Patient not taking: Reported on 05/31/2018 05/04/16   Rolland Porter, MD  docusate sodium (COLACE) 250 MG capsule Take 1 capsule (250 mg total) by mouth 2 (two) times daily as needed for constipation. Patient not taking: Reported on 05/04/2016 03/08/16   Nonie Hoyer, PA-C  HYDROcodone-acetaminophen (NORCO/VICODIN) 5-325 MG tablet Take 1-2 tablets by mouth every 6 (six) hours as needed for moderate pain or severe pain. Patient not taking: Reported on 05/31/2018 12/25/17   Robinson, [place] N, PA-C  ibuprofen (ADVIL,MOTRIN) 600 MG tablet Take 1 tablet (600 mg total) by mouth every 6 (six) hours as needed. Patient not taking: Reported on 05/31/2018 11/07/16   Derwood Kaplan, MD  magic mouthwash w/lidocaine SOLN Take  5 mLs by mouth 3 (three) times daily as needed for mouth pain. Patient not taking: Reported on 05/31/2018 11/07/16   Derwood KaplanNanavati, Ankit, MD  metroNIDAZOLE (FLAGYL) 500 MG tablet Take 1 tablet (500 mg total) by mouth 3 (three) times daily. Patient not taking: Reported on 05/31/2018 05/04/16   Rolland PorterJames, Mark, MD  naproxen (NAPROSYN) 500 MG tablet Take 1 tablet (500 mg total) by mouth 2 (two) times daily with a  meal. Patient not taking: Reported on 05/04/2016 03/11/15   Garlon HatchetSanders, Lisa M, PA-C  oxyCODONE-acetaminophen (PERCOCET/ROXICET) 5-325 MG tablet Take 1-2 tablets by mouth every 6 (six) hours as needed for moderate pain. Patient not taking: Reported on 05/04/2016 03/08/16   Nonie HoyerBaird, Megan N, PA-C    Family History No family history on file.  Social History Social History   Tobacco Use  . Smoking status: Current Some Day Smoker  . Smokeless tobacco: Never Used  Substance Use Topics  . Alcohol use: Yes    Alcohol/week: 1.8 oz    Types: 3 Cans of beer per week    Comment: ETOH on board today  . Drug use: No     Allergies   Patient has no known allergies.   Review of Systems Review of Systems  Constitutional: Positive for chills and fever.  HENT: Positive for ear pain and sore throat. Negative for congestion.   Respiratory: Negative.  Negative for cough.   Cardiovascular: Negative.   Gastrointestinal: Positive for nausea. Negative for diarrhea and vomiting.  Genitourinary: Negative for dysuria.  Musculoskeletal: Positive for myalgias.  Neurological: Positive for headaches.  Psychiatric/Behavioral: Negative for confusion.  All other systems reviewed and are negative.    Physical Exam Triage Vital Signs ED Triage Vitals [05/31/18 1708]  Enc Vitals Group     BP (!) 166/89     Pulse Rate 100     Resp 20     Temp (!) 102.7 F (39.3 C)     Temp Source Oral     SpO2 100 %     Weight      Height      Head Circumference      Peak Flow      Pain Score      Pain Loc      Pain Edu?      Excl. in GC?    No data found.  Updated Vital Signs BP (!) 166/89   Pulse 100   Temp (!) 102.7 F (39.3 C) (Oral)   Resp 20   SpO2 100%   Visual Acuity Right Eye Distance:   Left Eye Distance:   Bilateral Distance:    Right Eye Near:   Left Eye Near:    Bilateral Near:     Physical Exam  Constitutional: He is oriented to person, place, and time. He appears well-developed. No  distress.  HENT:  Head: Normocephalic.  Left Ear: Tympanic membrane and ear canal normal. No drainage, swelling or tenderness.  Ears:  Mouth/Throat: Mucous membranes are normal. Posterior oropharyngeal erythema present. No oropharyngeal exudate, posterior oropharyngeal edema or tonsillar abscesses.  Eyes: Pupils are equal, round, and reactive to light. Conjunctivae and EOM are normal.  Normal visual acuity with finger counting B/L  Cardiovascular: Normal rate and regular rhythm.  No murmur heard. Pulmonary/Chest: Effort normal and breath sounds normal. No stridor. No respiratory distress. He has no wheezes.  Abdominal: Soft. Bowel sounds are normal. He exhibits no distension and no mass. There is no tenderness. There is no guarding.  Neurological: He is alert and oriented to person, place, and time.  Nursing note and vitals reviewed.    UC Treatments / Results  Labs (all labs ordered are listed, but only abnormal results are displayed) Labs Reviewed - No data to display  EKG None  Radiology No results found.  Procedures Procedures (including critical care time)  Medications Ordered in UC Medications  acetaminophen (TYLENOL) tablet 650 mg (650 mg Oral Given 05/31/18 1715)    Initial Impression / Assessment and Plan / UC Course  I have reviewed the triage vital signs and the nursing notes.  Pertinent labs & imaging results that were available during my care of the patient were reviewed by me and considered in my medical decision making (see chart for details).  Clinical Course as of Jul 16 1200  Fri May 31, 2018  1815 Fever ?? Viral syndrome vs otitis media. Istat neg. Tylenol given with some improvement. Continue Tylenol prn,.   [KE]  1816 Pharyngitis. Neg strep test. Culture pending. Tylenol as needed recommended.   [KE]  1817 Otalgia. ?? Viral vs bacterial. I was unable to examine his right ear adequately. Will treat presumptively with Zpak. Return precaution  discussed.   [KE]  1818 Elevated BP. BP improved after temp came down a bit. Keep wel hydrated and rest at home. F/U as needed.   [KE]  Tue Jul 16, 2018  1159 Addendum. After obtaining verbal consent, soft ear curette was used to remove cerumen. Procedure was well tolerated without immediate complication. See otalgia management above.   [KE]    Clinical Course User Index [KE] Doreene Eland, MD    Viral pharyngitis  Fever, unspecified  Otalgia, unspecified laterality  Single episode of elevated blood pressure   Final Clinical Impressions(s) / UC Diagnoses   Final diagnoses:  None   Discharge Instructions   None    ED Prescriptions    None     Controlled Substance Prescriptions [place] Controlled Substance Registry consulted? Not Applicable   Doreene Eland, MD 05/31/18 Zollie Pee    Doreene Eland, MD 07/16/18 1201

## 2018-05-31 NOTE — Discharge Instructions (Signed)
It was nice seeing you. Your lab results are neg. Your strep test is neg. However, we sent it for culture. Given that you are running a fever and I was unable to examine your right ear in the setting of ear pain, I will treat you presumptively for ear infection. See us soon if still having symptoms.

## 2018-05-31 NOTE — ED Triage Notes (Signed)
Pt c/o fever since yesterday, nausea. Last had tylenol this morning at 9am. Pt also c/o headache.

## 2018-06-03 LAB — CULTURE, GROUP A STREP (THRC)

## 2018-12-17 IMAGING — CT CT PELVIS W/ CM
2 of 3 series · 17 of 46 positions shown, 19 images · IV contrast (Omni 300)
Comparison: CT of the pelvis performed 03/07/2016

CLINICAL DATA: Acute onset of rectal pain. Personal history of
rectal abscess.

EXAM:
CT PELVIS WITH CONTRAST
TECHNIQUE: Multidetector CT imaging of the pelvis was performed using the
standard protocol following the bolus administration of intravenous
contrast.
CONTRAST:  100mL N1DSP2-HMM IOPAMIDOL (N1DSP2-HMM) INJECTION 61%

[Series 3: pelvis with 5.0 · axial · 0.81mm/px · z∈[+660,+905]mm · 14 of 57 slices shown, 16 images]
[im 4/57  soft-tissue]
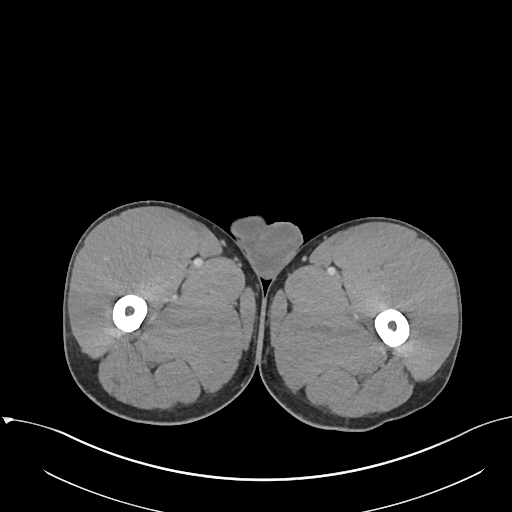
[im 4/57  bone]
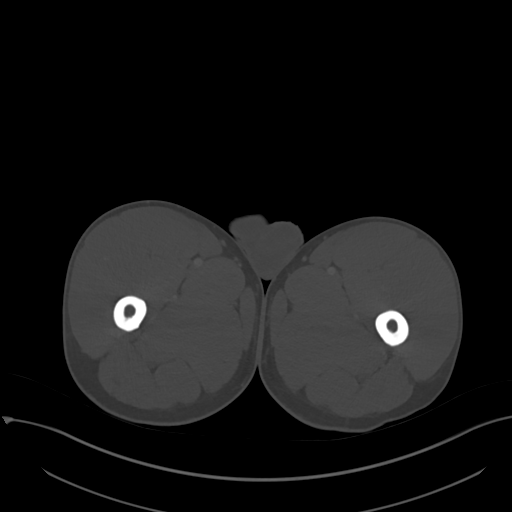
[im 8/57  soft-tissue]
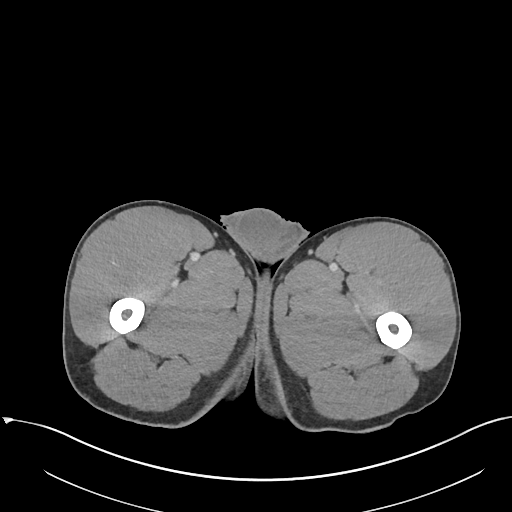
[im 11/57  soft-tissue]
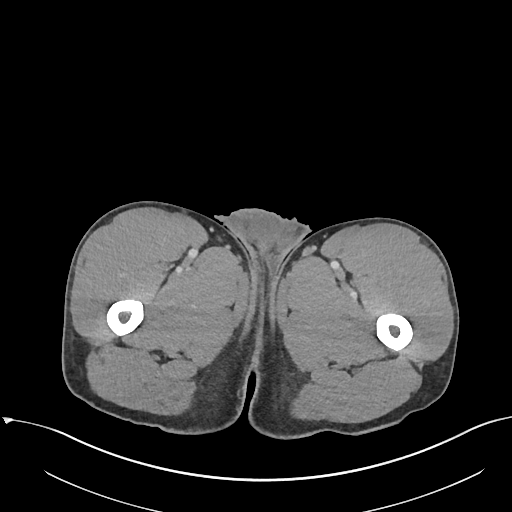
[im 15/57  soft-tissue]
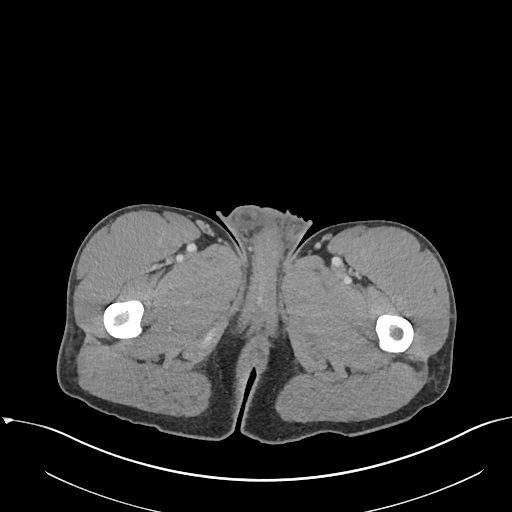
[im 19/57  soft-tissue]
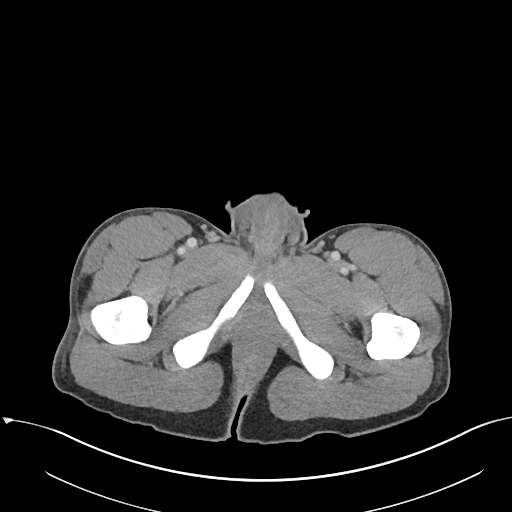
[im 22/57  soft-tissue]
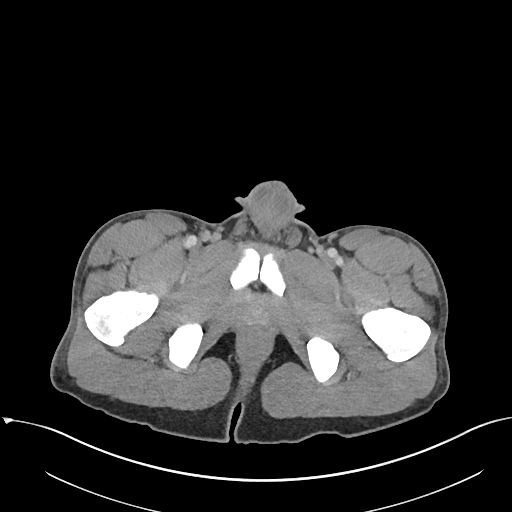
[im 26/57  soft-tissue]
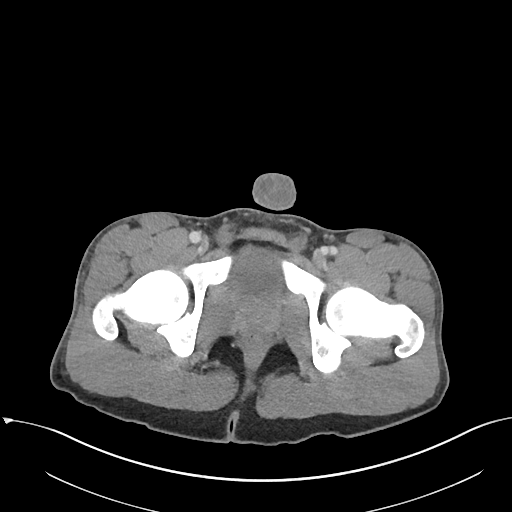
[im 31/57  soft-tissue]
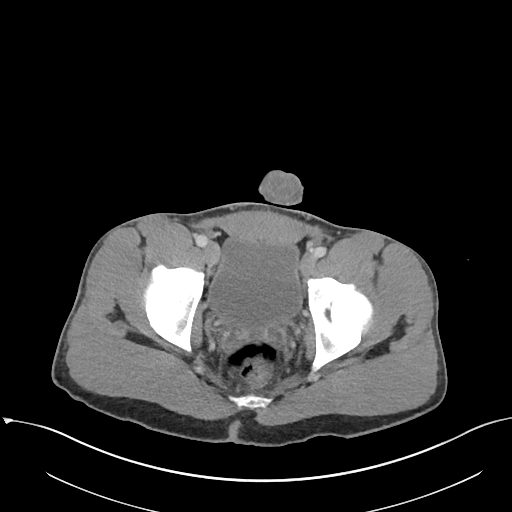
[im 35/57  soft-tissue]
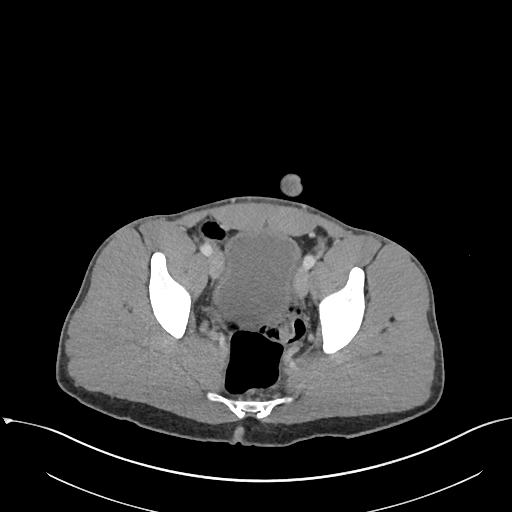
[im 35/57  bone]
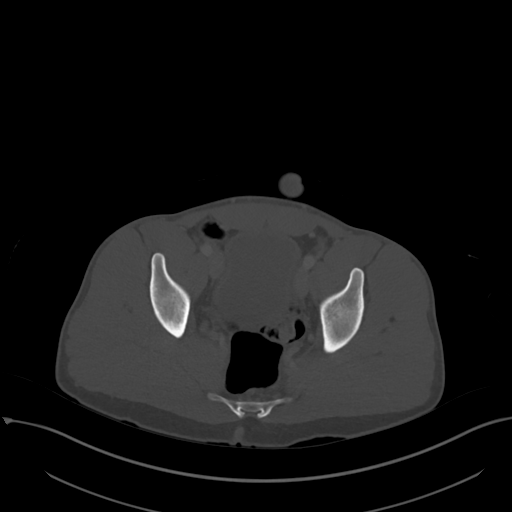
[im 38/57  soft-tissue]
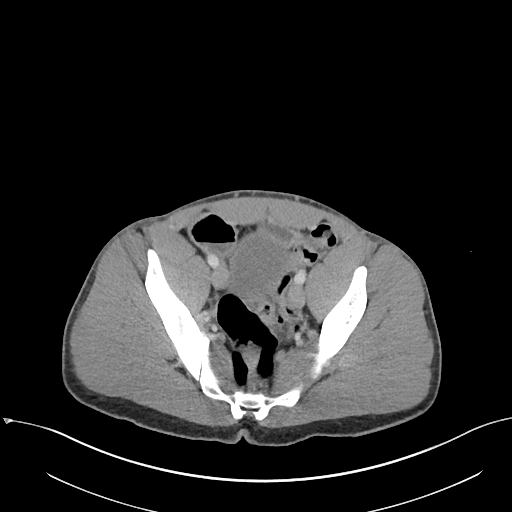
[im 42/57  soft-tissue]
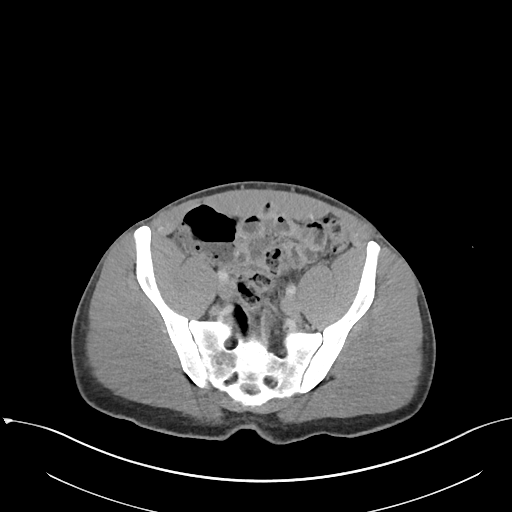
[im 46/57  soft-tissue]
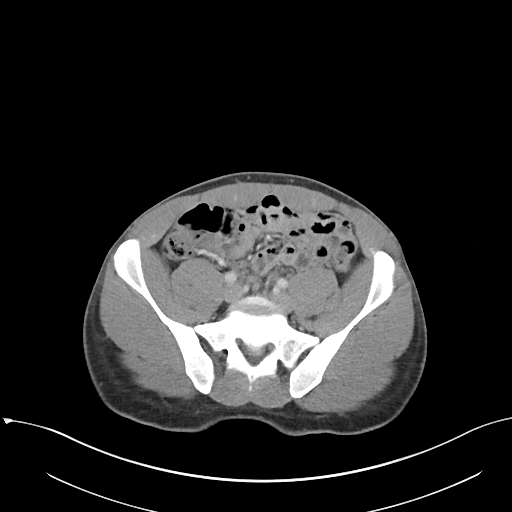
[im 49/57  soft-tissue]
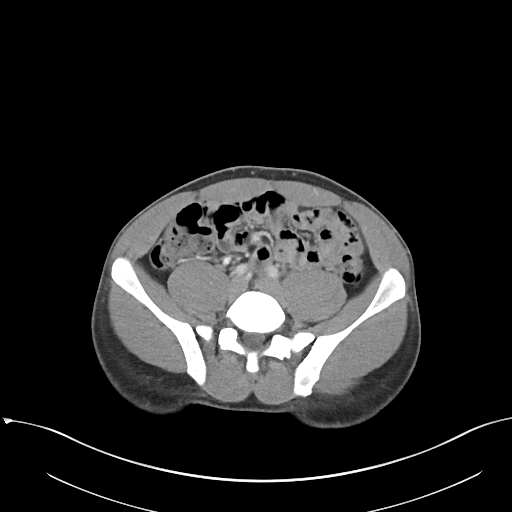
[im 53/57  soft-tissue]
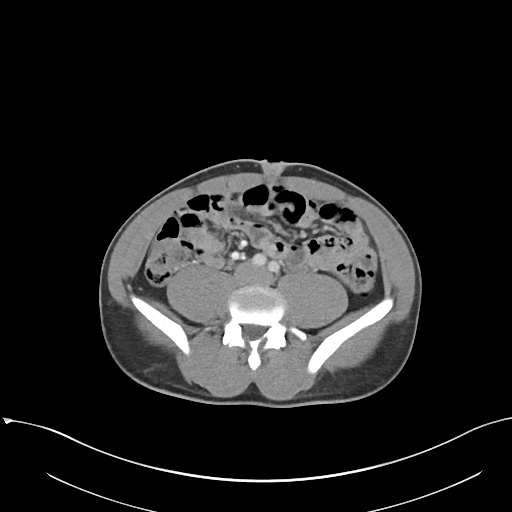

[Series 5: pelvis with 2.0 cor · coronal · 0.57mm/px · 3 of 115 slices shown]
[im 39/115  soft-tissue]
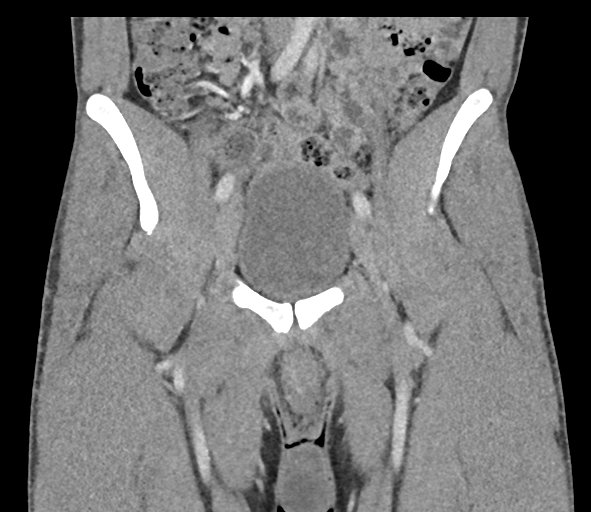
[im 51/115  soft-tissue]
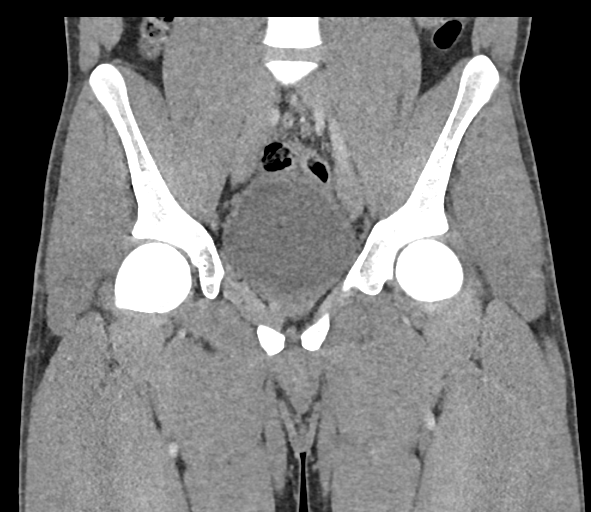
[im 64/115  soft-tissue]
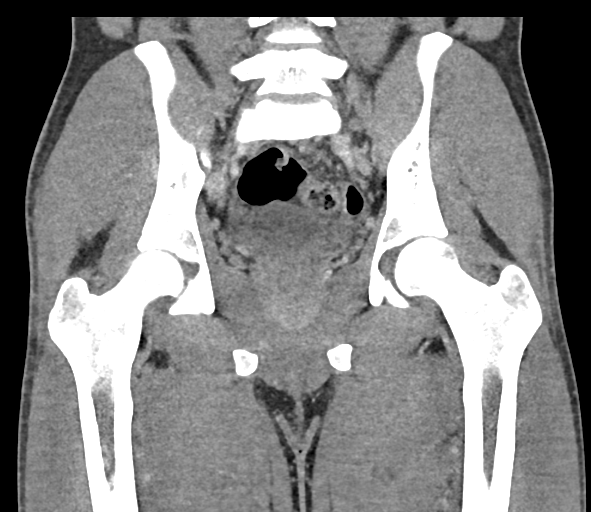

[17 of 46 positions shown; findings below may reference images not displayed]

FINDINGS: Urinary Tract: The bladder is mildly distended and grossly
unremarkable.

Bowel: Visualized small and large bowel loops are grossly
unremarkable.

Vascular/Lymphatic: The visualized vasculature is grossly
unremarkable. No retroperitoneal or pelvic sidewall lymphadenopathy
is seen.

Reproductive:  The prostate is normal in size.

Other: Mild soft tissue inflammation is noted tracking about the
inferior anorectal canal, extending along the gluteal cleft,
slightly more prominent on the right. There is trace edema noted to
the left of the anorectal canal, without definite evidence of a
drainable abscess.

Musculoskeletal: No acute osseous abnormalities are identified. The
visualized musculature is unremarkable in appearance.
IMPRESSION: 1. No definite evidence of drainable abscess.
2. Mild soft tissue inflammation about the inferior anorectal canal,
extending along the gluteal cleft, slightly more prominent on the
right. Trace edema to the left of the anorectal canal.

## 2019-12-06 ENCOUNTER — Encounter (HOSPITAL_COMMUNITY): Payer: Self-pay | Admitting: Emergency Medicine

## 2019-12-06 ENCOUNTER — Emergency Department (HOSPITAL_COMMUNITY)
Admission: EM | Admit: 2019-12-06 | Discharge: 2019-12-06 | Disposition: A | Discharge status: Home / Self Care | Payer: Self-pay | Attending: Emergency Medicine | Admitting: Emergency Medicine

## 2019-12-06 ENCOUNTER — Other Ambulatory Visit: Payer: Self-pay

## 2019-12-06 DIAGNOSIS — W500XXA Accidental hit or strike by another person, initial encounter: Secondary | ICD-10-CM | POA: Insufficient documentation

## 2019-12-06 DIAGNOSIS — F17210 Nicotine dependence, cigarettes, uncomplicated: Secondary | ICD-10-CM | POA: Insufficient documentation

## 2019-12-06 DIAGNOSIS — H209 Unspecified iridocyclitis: Secondary | ICD-10-CM

## 2019-12-06 DIAGNOSIS — H20012 Primary iridocyclitis, left eye: Secondary | ICD-10-CM | POA: Insufficient documentation

## 2019-12-06 MED ORDER — FLUORESCEIN SODIUM 1 MG OP STRP
1.0000 | ORAL_STRIP | Freq: Once | OPHTHALMIC | Status: AC
  Administered 2019-12-06: 13:00:00 1 via OPHTHALMIC
  Filled 2019-12-06: qty 1

## 2019-12-06 MED ORDER — NEOMYCIN-POLYMYXIN-DEXAMETH 3.5-10000-0.1 OP SUSP: 1.0000 [drp] | OPHTHALMIC | 0 refills | Status: AC

## 2019-12-06 MED ORDER — TETRACAINE HCL 0.5 % OP SOLN
2.0000 [drp] | Freq: Once | OPHTHALMIC | Status: AC
  Administered 2019-12-06: 13:00:00 2 [drp] via OPHTHALMIC
  Filled 2019-12-06: qty 4

## 2019-12-06 NOTE — ED Notes (Signed)
Patient verbalizes understanding of discharge instructions . Opportunity for questions and answers were provided . Armband removed by staff ,Pt discharged from ED. W/C  offered at D/C  and Declined W/C at D/C and was escorted to lobby by RN.  
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
## 2019-12-06 NOTE — Discharge Instructions (Addendum)
You were seen in the ER for left eye discomfort and blurred vision  Your symptoms are most consistent with traumatic iritis or inflammation of your iris  This is treated with antibiotic and steroid eyedrops  Protect your eye from bright lights, debris, wind and water  Symptoms should improve slowly over the next 2 to 3 days  I have spoken to Dr. Sherryll Burger who agrees with treatment and can see you in the office on Monday.  Call his office on Monday morning to confirm, states he can probably fit you in around 2:30 PM.  Return to the ER for significant face or eyelid swelling, redness, warmth, pus, fever or severe headache with complete vision loss or significant changes in your vision
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
## 2020-12-08 ENCOUNTER — Ambulatory Visit (HOSPITAL_COMMUNITY)
Admission: EM | Admit: 2020-12-08 | Discharge: 2020-12-08 | Disposition: A | Discharge status: Home / Self Care | Payer: HRSA Program | Attending: Emergency Medicine | Admitting: Emergency Medicine

## 2020-12-08 ENCOUNTER — Encounter (HOSPITAL_COMMUNITY): Payer: Self-pay

## 2020-12-08 ENCOUNTER — Other Ambulatory Visit: Payer: Self-pay

## 2020-12-08 DIAGNOSIS — R509 Fever, unspecified: Secondary | ICD-10-CM | POA: Diagnosis not present

## 2020-12-08 DIAGNOSIS — U071 COVID-19: Secondary | ICD-10-CM | POA: Diagnosis not present

## 2020-12-08 DIAGNOSIS — F17210 Nicotine dependence, cigarettes, uncomplicated: Secondary | ICD-10-CM | POA: Diagnosis not present

## 2020-12-08 DIAGNOSIS — R059 Cough, unspecified: Secondary | ICD-10-CM | POA: Diagnosis not present

## 2020-12-08 DIAGNOSIS — B349 Viral infection, unspecified: Secondary | ICD-10-CM

## 2020-12-08 LAB — SARS CORONAVIRUS 2 (TAT 6-24 HRS): SARS Coronavirus 2: POSITIVE — AB

## 2020-12-08 NOTE — Discharge Instructions (Signed)
Your COVID test is pending.  You should self quarantine until the test result is back.    Take Tylenol or ibuprofen as needed for fever or discomfort.  Rest and keep yourself hydrated.    Follow-up with your primary care provider if your symptoms are not improving.     

## 2020-12-08 NOTE — ED Triage Notes (Signed)
Patient presents to Urgent Care with complaints of cough, low grade fever 99.5, generalized body aches x 3 days. Pt states girlfriend tested positive for covid.  Treating cough with OTC cold meds.   Denies fever.

## 2020-12-08 NOTE — ED Provider Notes (Signed)
[facility]    CSN: [id number] Arrival date & time: 12/08/20  9937      History   Chief Complaint Chief Complaint  Patient presents with  . Generalized Body Aches    HPI Jose Hernandez is a 30 y.o. male.   Patient presents with 3-day history of fever, body aches, cough.  His girlfriend tested positive for COVID yesterday.  He denies shortness of breath, vomiting, diarrhea.  Treatment attempted at home with OTC cold medication.  His medical history includes anemia.  The history is provided by the patient and medical records.    Past Medical History:  Diagnosis Date  . Abscess   . Anemia   . History of epistaxis    years ago    Patient Active Problem List   Diagnosis Date Noted  . Perirectal abscess 03/07/2016  . EPISTAXIS, RECURRENT 04/12/2009  . RHINITIS, ALLERGIC 01/17/2007    Past Surgical History:  Procedure Laterality Date  . INCISION AND DRAINAGE PERIRECTAL ABSCESS N/A 03/07/2016   Procedure: IRRIGATION AND DEBRIDEMENT PERIRECTAL ABSCESS;  Surgeon: Manus Rudd, MD;  Location: [facility] OR;  Service: General;  Laterality: N/A;       Home Medications    Prior to Admission medications   Medication Sig Start Date End Date Taking? Authorizing Provider  azithromycin (ZITHROMAX) 250 MG tablet Take 1 tablet (250 mg total) by mouth daily. Take first 2 tablets together, then 1 every day until finished. 05/31/18   Doreene Eland, MD  ciprofloxacin (CIPRO) 500 MG tablet Take 1 tablet (500 mg total) by mouth 2 (two) times daily. Patient not taking: No sig reported 05/04/16   Rolland Porter, MD  diphenoxylate-atropine (LOMOTIL) 2.5-0.025 MG tablet Take 1 tablet by mouth 4 (four) times daily as needed for diarrhea or loose stools. Patient not taking: No sig reported 05/04/16   Rolland Porter, MD  docusate sodium (COLACE) 250 MG capsule Take 1 capsule (250 mg total) by mouth 2 (two) times daily as needed for constipation. Patient not taking: No sig reported 03/08/16    Nonie Hoyer, PA-C  HYDROcodone-acetaminophen (NORCO/VICODIN) 5-325 MG tablet Take 1-2 tablets by mouth every 6 (six) hours as needed for moderate pain or severe pain. Patient not taking: No sig reported 12/25/17   Robinson, [place] N, PA-C  ibuprofen (ADVIL,MOTRIN) 600 MG tablet Take 1 tablet (600 mg total) by mouth every 6 (six) hours as needed. Patient not taking: Reported on 05/31/2018 11/07/16   Derwood Kaplan, MD  magic mouthwash w/lidocaine SOLN Take 5 mLs by mouth 3 (three) times daily as needed for mouth pain. Patient not taking: No sig reported 11/07/16   Derwood Kaplan, MD  metroNIDAZOLE (FLAGYL) 500 MG tablet Take 1 tablet (500 mg total) by mouth 3 (three) times daily. Patient not taking: No sig reported 05/04/16   Rolland Porter, MD  naproxen (NAPROSYN) 500 MG tablet Take 1 tablet (500 mg total) by mouth 2 (two) times daily with a meal. Patient not taking: No sig reported 03/11/15   Garlon Hatchet, PA-C  oxyCODONE-acetaminophen (PERCOCET/ROXICET) 5-325 MG tablet Take 1-2 tablets by mouth every 6 (six) hours as needed for moderate pain. Patient not taking: No sig reported 03/08/16   Bradd Canary    Family History History reviewed. No pertinent family history.  Social History Social History   Tobacco Use  . Smoking status: Current Some Day Smoker    Types: Cigarettes  . Smokeless tobacco: Never Used  Vaping Use  . Vaping Use:  Never used  Substance Use Topics  . Alcohol use: Yes    Alcohol/week: 3.0 standard drinks    Types: 3 Cans of beer per week  . Drug use: No     Allergies   Patient has no known allergies.   Review of Systems Review of Systems  Constitutional: Positive for fever. Negative for chills.  HENT: Negative for ear pain and sore throat.   Eyes: Negative for pain and visual disturbance.  Respiratory: Positive for cough. Negative for shortness of breath.   Cardiovascular: Negative for chest pain and palpitations.  Gastrointestinal: Negative for  abdominal pain, diarrhea and vomiting.  Genitourinary: Negative for dysuria and hematuria.  Musculoskeletal: Negative for arthralgias and back pain.  Skin: Negative for color change and rash.  Neurological: Negative for seizures and syncope.  All other systems reviewed and are negative.    Physical Exam Triage Vital Signs ED Triage Vitals  Enc Vitals Group     BP      Pulse      Resp      Temp      Temp src      SpO2      Weight      Height      Head Circumference      Peak Flow      Pain Score      Pain Loc      Pain Edu?      Excl. in GC?    No data found.  Updated Vital Signs BP (S) (!) 177/121 (BP Location: Left Arm) Comment: pt reports baseline 140-150's  Pulse 83   Temp 99.5 F (37.5 C) (Oral)   Resp 16   Wt 170 lb (77.1 kg)   SpO2 98%   BMI 25.10 kg/m   Visual Acuity Right Eye Distance:   Left Eye Distance:   Bilateral Distance:    Right Eye Near:   Left Eye Near:    Bilateral Near:     Physical Exam Vitals and nursing note reviewed.  Constitutional:      General: He is not in acute distress.    Appearance: He is well-developed and well-nourished.  HENT:     Head: Normocephalic and atraumatic.     Right Ear: Tympanic membrane normal.     Left Ear: Tympanic membrane normal.     Nose: Nose normal.     Mouth/Throat:     Mouth: Mucous membranes are moist.     Pharynx: Oropharynx is clear.  Eyes:     Conjunctiva/sclera: Conjunctivae normal.  Cardiovascular:     Rate and Rhythm: Normal rate and regular rhythm.     Heart sounds: Normal heart sounds.  Pulmonary:     Effort: Pulmonary effort is normal. No respiratory distress.     Breath sounds: Normal breath sounds.  Abdominal:     Palpations: Abdomen is soft.     Tenderness: There is no abdominal tenderness.  Musculoskeletal:        General: No edema.     Cervical back: Neck supple.  Skin:    General: Skin is warm and dry.     Findings: No rash.  Neurological:     General: No focal  deficit present.     Mental Status: He is alert and oriented to person, place, and time.     Gait: Gait normal.  Psychiatric:        Mood and Affect: Mood and affect and mood normal.  Behavior: Behavior normal.      UC Treatments / Results  Labs (all labs ordered are listed, but only abnormal results are displayed) Labs Reviewed  SARS CORONAVIRUS 2 (TAT 6-24 HRS)    EKG   Radiology No results found.  Procedures Procedures (including critical care time)  Medications Ordered in UC Medications - No data to display  Initial Impression / Assessment and Plan / UC Course  I have reviewed the triage vital signs and the nursing notes.  Pertinent labs & imaging results that were available during my care of the patient were reviewed by me and considered in my medical decision making (see chart for details).   Viral illness.  COVID pending.  Instructed patient to self quarantine until the test results are back.  Discussed symptomatic treatment including Tylenol, rest, hydration.  Instructed patient to follow up with PCP if his symptoms are not improving.  Patient agrees to plan of care.   Final Clinical Impressions(s) / UC Diagnoses   Final diagnoses:  Viral illness     Discharge Instructions     Your COVID test is pending.  You should self quarantine until the test result is back.    Take Tylenol or ibuprofen as needed for fever or discomfort.  Rest and keep yourself hydrated.    Follow-up with your primary care provider if your symptoms are not improving.        ED Prescriptions    None     PDMP not reviewed this encounter.   Mickie Bail, NP 12/08/20 1006

## 2022-03-30 ENCOUNTER — Other Ambulatory Visit: Payer: Self-pay

## 2022-03-30 ENCOUNTER — Emergency Department (HOSPITAL_COMMUNITY)
Admission: EM | Admit: 2022-03-30 | Discharge: 2022-03-31 | Disposition: A | Discharge status: Home / Self Care | Payer: Self-pay | Attending: Emergency Medicine | Admitting: Emergency Medicine

## 2022-03-30 ENCOUNTER — Encounter (HOSPITAL_COMMUNITY): Payer: Self-pay | Admitting: Emergency Medicine

## 2022-03-30 DIAGNOSIS — S51812A Laceration without foreign body of left forearm, initial encounter: Secondary | ICD-10-CM | POA: Insufficient documentation

## 2022-03-30 DIAGNOSIS — Z23 Encounter for immunization: Secondary | ICD-10-CM | POA: Insufficient documentation

## 2022-03-30 DIAGNOSIS — Y92524 Gas station as the place of occurrence of the external cause: Secondary | ICD-10-CM | POA: Insufficient documentation

## 2022-03-30 DIAGNOSIS — W260XXA Contact with knife, initial encounter: Secondary | ICD-10-CM | POA: Insufficient documentation

## 2022-03-30 DIAGNOSIS — S41111A Laceration without foreign body of right upper arm, initial encounter: Secondary | ICD-10-CM | POA: Insufficient documentation

## 2022-03-30 DIAGNOSIS — T148XXA Other injury of unspecified body region, initial encounter: Secondary | ICD-10-CM

## 2022-03-30 DIAGNOSIS — S41119A Laceration without foreign body of unspecified upper arm, initial encounter: Secondary | ICD-10-CM

## 2022-03-30 LAB — I-STAT CHEM 8, ED
BUN: 9 mg/dL (ref 6–20)
Calcium, Ion: 1.01 mmol/L — ABNORMAL LOW (ref 1.15–1.40)
Chloride: 103 mmol/L (ref 98–111)
Creatinine, Ser: 1.3 mg/dL — ABNORMAL HIGH (ref 0.61–1.24)
Glucose, Bld: 117 mg/dL — ABNORMAL HIGH (ref 70–99)
HCT: 40 % (ref 39.0–52.0)
Hemoglobin: 13.6 g/dL (ref 13.0–17.0)
Potassium: 3.2 mmol/L — ABNORMAL LOW (ref 3.5–5.1)
Sodium: 138 mmol/L (ref 135–145)
TCO2: 22 mmol/L (ref 22–32)

## 2022-03-30 MED ORDER — TETANUS-DIPHTH-ACELL PERTUSSIS 5-2.5-18.5 LF-MCG/0.5 IM SUSY
0.5000 mL | PREFILLED_SYRINGE | Freq: Once | INTRAMUSCULAR | Status: AC
  Administered 2022-03-30: 0.5 mL via INTRAMUSCULAR
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
## 2022-03-30 NOTE — Progress Notes (Signed)
Orthopedic Tech Progress Note ?Patient Details:  ?Jose Hernandez ?1991/08/26 ?[id number] ? ?Patient ID: MOHD CLEMONS, male   DOB: Jul 25, 1991, 31 y.o.   MRN: [id number] ?Level 2 trauma not needed. ?Al Decant ?03/30/2022, 11:32 PM ? ?
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
## 2022-03-30 NOTE — ED Provider Notes (Signed)
..  Laceration Repair ? ?Date/Time: 03/30/2022 10:51 PM ?Performed by: Arthor Captain, PA-C ?Authorized by: Arthor Captain, PA-C  ? ?Consent:  ?  Consent obtained:  Verbal ?  Consent given by:  Patient ?  Risks discussed:  Infection, pain and retained foreign body ?  Alternatives discussed:  No treatment ?Universal protocol:  ?  Patient identity confirmed:  Verbally with patient ?Anesthesia:  ?  Anesthesia method:  Local infiltration ?  Local anesthetic:  Lidocaine 1% w/o epi ?Laceration details:  ?  Location:  Shoulder/arm ?  Shoulder/arm location:  L lower arm ?  Length (cm):  10 ?  Depth (mm):  10 ?Pre-procedure details:  ?  Preparation:  Patient was prepped and draped in usual sterile fashion ?Exploration:  ?  Wound exploration: wound explored through full range of motion and entire depth of wound visualized   ?  Wound extent: no areolar tissue violation noted, no fascia violation noted, no foreign bodies/material noted, no muscle damage noted, no nerve damage noted, no tendon damage noted, no underlying fracture noted and no vascular damage noted   ?  Contaminated: no   ?Treatment:  ?  Area cleansed with:  Saline and Shur-Clens ?  Irrigation method:  Pressure wash ?  Visualized foreign bodies/material removed: no   ?  Debridement:  None ?Skin repair:  ?  Repair method:  Staples ?  Number of staples:  10 ?Approximation:  ?  Approximation:  Close ?Repair type:  ?  Repair type:  Simple ?Post-procedure details:  ?  Dressing:  Open (no dressing) ?  Procedure completion:  Tolerated well, no immediate complications ? ?  ?Arthor Captain, PA-C ?03/30/22 2259 ? ?  ?Gwyneth Sprout, MD ?04/04/22 1110 ? ?

## 2022-03-31 NOTE — ED Notes (Signed)
Pt given sandwich, crackers, peanut butter, and apple juice ? ?

## 2022-04-23 ENCOUNTER — Other Ambulatory Visit: Payer: Self-pay

## 2022-04-23 ENCOUNTER — Encounter (HOSPITAL_COMMUNITY): Payer: Self-pay

## 2022-04-23 ENCOUNTER — Emergency Department (HOSPITAL_COMMUNITY)
Admission: EM | Admit: 2022-04-23 | Discharge: 2022-04-23 | Disposition: A | Discharge status: Home / Self Care | Payer: Self-pay | Attending: Emergency Medicine | Admitting: Emergency Medicine

## 2022-04-23 ENCOUNTER — Emergency Department (HOSPITAL_COMMUNITY): Payer: Self-pay

## 2022-04-23 DIAGNOSIS — D72829 Elevated white blood cell count, unspecified: Secondary | ICD-10-CM | POA: Insufficient documentation

## 2022-04-23 DIAGNOSIS — R61 Generalized hyperhidrosis: Secondary | ICD-10-CM | POA: Insufficient documentation

## 2022-04-23 DIAGNOSIS — R569 Unspecified convulsions: Secondary | ICD-10-CM | POA: Insufficient documentation

## 2022-04-23 DIAGNOSIS — F10130 Alcohol abuse with withdrawal, uncomplicated: Secondary | ICD-10-CM | POA: Insufficient documentation

## 2022-04-23 DIAGNOSIS — S01511A Laceration without foreign body of lip, initial encounter: Secondary | ICD-10-CM | POA: Insufficient documentation

## 2022-04-23 DIAGNOSIS — R7989 Other specified abnormal findings of blood chemistry: Secondary | ICD-10-CM | POA: Insufficient documentation

## 2022-04-23 DIAGNOSIS — W01198A Fall on same level from slipping, tripping and stumbling with subsequent striking against other object, initial encounter: Secondary | ICD-10-CM | POA: Insufficient documentation

## 2022-04-23 DIAGNOSIS — Z4802 Encounter for removal of sutures: Secondary | ICD-10-CM | POA: Insufficient documentation

## 2022-04-23 DIAGNOSIS — E876 Hypokalemia: Secondary | ICD-10-CM | POA: Insufficient documentation

## 2022-04-23 DIAGNOSIS — Y9301 Activity, walking, marching and hiking: Secondary | ICD-10-CM | POA: Insufficient documentation

## 2022-04-23 LAB — I-STAT CHEM 8, ED
BUN: 9 mg/dL (ref 6–20)
Calcium, Ion: 1.2 mmol/L (ref 1.15–1.40)
Chloride: 96 mmol/L — ABNORMAL LOW (ref 98–111)
Creatinine, Ser: 0.9 mg/dL (ref 0.61–1.24)
Glucose, Bld: 109 mg/dL — ABNORMAL HIGH (ref 70–99)
HCT: 48 % (ref 39.0–52.0)
Hemoglobin: 16.3 g/dL (ref 13.0–17.0)
Potassium: 3.1 mmol/L — ABNORMAL LOW (ref 3.5–5.1)
Sodium: 135 mmol/L (ref 135–145)
TCO2: 19 mmol/L — ABNORMAL LOW (ref 22–32)

## 2022-04-23 LAB — CBC WITH DIFFERENTIAL/PLATELET
Abs Immature Granulocytes: 0.04 10*3/uL (ref 0.00–0.07)
Basophils Absolute: 0 10*3/uL (ref 0.0–0.1)
Basophils Relative: 0 %
Eosinophils Absolute: 0 10*3/uL (ref 0.0–0.5)
Eosinophils Relative: 0 %
HCT: 40.2 % (ref 39.0–52.0)
Hemoglobin: 12.8 g/dL — ABNORMAL LOW (ref 13.0–17.0)
Immature Granulocytes: 0 %
Lymphocytes Relative: 22 %
Lymphs Abs: 2.4 10*3/uL (ref 0.7–4.0)
MCH: 27 pg (ref 26.0–34.0)
MCHC: 31.8 g/dL (ref 30.0–36.0)
MCV: 84.8 fL (ref 80.0–100.0)
Monocytes Absolute: 1.6 10*3/uL — ABNORMAL HIGH (ref 0.1–1.0)
Monocytes Relative: 15 %
Neutro Abs: 6.7 10*3/uL (ref 1.7–7.7)
Neutrophils Relative %: 63 %
Platelets: 204 10*3/uL (ref 150–400)
RBC: 4.74 MIL/uL (ref 4.22–5.81)
RDW: 16.7 % — ABNORMAL HIGH (ref 11.5–15.5)
WBC: 10.8 10*3/uL — ABNORMAL HIGH (ref 4.0–10.5)
nRBC: 0 % (ref 0.0–0.2)

## 2022-04-23 LAB — COMPREHENSIVE METABOLIC PANEL
ALT: 63 U/L — ABNORMAL HIGH (ref 0–44)
AST: 97 U/L — ABNORMAL HIGH (ref 15–41)
Albumin: 4.6 g/dL (ref 3.5–5.0)
Alkaline Phosphatase: 60 U/L (ref 38–126)
Anion gap: 26 — ABNORMAL HIGH (ref 5–15)
BUN: 7 mg/dL (ref 6–20)
CO2: 16 mmol/L — ABNORMAL LOW (ref 22–32)
Calcium: 10.2 mg/dL (ref 8.9–10.3)
Chloride: 94 mmol/L — ABNORMAL LOW (ref 98–111)
Creatinine, Ser: 1.3 mg/dL — ABNORMAL HIGH (ref 0.61–1.24)
GFR, Estimated: >60 mL/min (ref >60)
Glucose, Bld: 114 mg/dL — ABNORMAL HIGH (ref 70–99)
Potassium: 3.1 mmol/L — ABNORMAL LOW (ref 3.5–5.1)
Sodium: 136 mmol/L (ref 135–145)
Total Bilirubin: 1.5 mg/dL — ABNORMAL HIGH (ref 0.3–1.2)
Total Protein: 9.4 g/dL — ABNORMAL HIGH (ref 6.5–8.1)

## 2022-04-23 LAB — CBG MONITORING, ED: Glucose-Capillary: 115 mg/dL — ABNORMAL HIGH (ref 70–99)

## 2022-04-23 MED ORDER — CHLORDIAZEPOXIDE HCL 25 MG PO CAPS
50.0000 mg | ORAL_CAPSULE | Freq: Once | ORAL | Status: AC
  Administered 2022-04-23: 50 mg via ORAL
  Filled 2022-04-23: qty 2

## 2022-04-23 MED ORDER — LACTATED RINGERS IV BOLUS
1000.0000 mL | Freq: Once | INTRAVENOUS | Status: AC
  Administered 2022-04-23: 1000 mL via INTRAVENOUS

## 2022-04-23 MED ORDER — ACETAMINOPHEN 500 MG PO TABS
1000.0000 mg | ORAL_TABLET | Freq: Once | ORAL | Status: AC
  Administered 2022-04-23: 1000 mg via ORAL
  Filled 2022-04-23: qty 2

## 2022-04-23 MED ORDER — CHLORDIAZEPOXIDE HCL 25 MG PO CAPS: ORAL_CAPSULE | ORAL | 0 refills | Status: DC

## 2022-04-23 MED ORDER — POTASSIUM CHLORIDE CRYS ER 20 MEQ PO TBCR
40.0000 meq | EXTENDED_RELEASE_TABLET | Freq: Once | ORAL | Status: AC
  Administered 2022-04-23: 40 meq via ORAL
  Filled 2022-04-23: qty 2

## 2022-04-23 MED ORDER — LORAZEPAM 1 MG PO TABS
1.0000 mg | ORAL_TABLET | Freq: Once | ORAL | Status: AC
  Administered 2022-04-23: 1 mg via ORAL
  Filled 2022-04-23: qty 1

## 2022-04-23 NOTE — ED Triage Notes (Signed)
Per ED Staff, Pt was sitting in the lobby after being discharged when he had a seizure.  Pt denies pain.  Sts he "felt lightheadedness."  Pt reports he has had seizures x2 before, but does not take medication.

## 2022-04-23 NOTE — ED Notes (Signed)
Pt had a witnessed seizure by this Probation officer in the ED lobby. Pt was recently discharged from ED and seen walking back into the ED. Pt began yelling, threw his bag and fell to the ground and hitting his head. He seized for approx. 1 minute. Staff called for assistance and immediately brought back. C-collar placed on patient.

## 2022-04-23 NOTE — Discharge Instructions (Addendum)
You had a seizure today that I think was likely due to alcohol withdrawal.  You did have a CT of your head and your C-spine which did not show any abnormalities.  Her lab work was also reassuring.  You did have a slightly low potassium so you were given potassium as well as IV fluids in the emergency department.  You were also given your first dose of Librium while in the emergency department.  Please follow the Librium taper below, starting tomorrow morning. For the librium taper: - Take 50mg  (2 tablets) three times a day for 1 day - Then take 50mg  (2 tablets) twice a day for 1 day - Then take 50mg  (1 tablet) once a day for 1 day - Then take 25mg  (1 tablet) once a day for 1 day

## 2022-04-23 NOTE — ED Provider Notes (Signed)
[facility] Provider Note   CSN: [id number] Arrival date & time: 04/23/22  1815     History  No chief complaint on file.   Jose Hernandez is a 31 y.o. male with no pertinent past medical history presenting to the ED after a seizure.  Patient was reportedly seen earlier today for a suture removal and was discharged a few hours ago.  He reportedly walked into the main lobby, threw down his bag, and then fell backwards hitting the back of his head and started having tonic-clonic seizure activity lasting about 1 minute.  Patient does not remember the event and on my evaluation is confused.  He denies a history of seizures.  He is not sure why he returned to the emergency department.  Currently denies any pain.  HPI     Home Medications Prior to Admission medications   Medication Sig Start Date End Date Taking? Authorizing Provider  chlordiazePOXIDE (LIBRIUM) 25 MG capsule 50mg  PO TID x 1D, then 50mg  PO BID X 1D, then 50mg  PO QD X 1D, then 25mg  PO x1D 04/23/22  Yes Laurence ComptonFoster, Tonnia Bardin, MD  azithromycin (ZITHROMAX) 250 MG tablet Take 1 tablet (250 mg total) by mouth daily. Take first 2 tablets together, then 1 every day until finished. 05/31/18   Doreene ElandEniola, Kehinde T, MD  ciprofloxacin (CIPRO) 500 MG tablet Take 1 tablet (500 mg total) by mouth 2 (two) times daily. Patient not taking: No sig reported 05/04/16   Rolland PorterJames, Mark, MD  diphenoxylate-atropine (LOMOTIL) 2.5-0.025 MG tablet Take 1 tablet by mouth 4 (four) times daily as needed for diarrhea or loose stools. Patient not taking: No sig reported 05/04/16   Rolland PorterJames, Mark, MD  docusate sodium (COLACE) 250 MG capsule Take 1 capsule (250 mg total) by mouth 2 (two) times daily as needed for constipation. Patient not taking: No sig reported 03/08/16   Nonie HoyerBaird, Megan N, PA-C  HYDROcodone-acetaminophen (NORCO/VICODIN) 5-325 MG tablet Take 1-2 tablets by mouth every 6 (six) hours as needed for moderate pain or severe  pain. Patient not taking: No sig reported 12/25/17   Robinson, [place] N, PA-C  ibuprofen (ADVIL,MOTRIN) 600 MG tablet Take 1 tablet (600 mg total) by mouth every 6 (six) hours as needed. Patient not taking: Reported on 05/31/2018 11/07/16   Derwood KaplanNanavati, Ankit, MD  magic mouthwash w/lidocaine SOLN Take 5 mLs by mouth 3 (three) times daily as needed for mouth pain. Patient not taking: No sig reported 11/07/16   Derwood KaplanNanavati, Ankit, MD  metroNIDAZOLE (FLAGYL) 500 MG tablet Take 1 tablet (500 mg total) by mouth 3 (three) times daily. Patient not taking: No sig reported 05/04/16   Rolland PorterJames, Mark, MD  naproxen (NAPROSYN) 500 MG tablet Take 1 tablet (500 mg total) by mouth 2 (two) times daily with a meal. Patient not taking: No sig reported 03/11/15   Garlon HatchetSanders, Lisa M, PA-C  oxyCODONE-acetaminophen (PERCOCET/ROXICET) 5-325 MG tablet Take 1-2 tablets by mouth every 6 (six) hours as needed for moderate pain. Patient not taking: No sig reported 03/08/16   Nonie HoyerBaird, Megan N, PA-C      Allergies    Patient has no known allergies.    Review of Systems   Review of Systems  Unable to perform ROS: Mental status change   Physical Exam Updated Vital Signs BP 128/88   Pulse 89   Resp 19   Ht 5\' 9"  (1.753 m)   Wt 81.6 kg   SpO2 99%   BMI 26.58 kg/m  Physical Exam  Constitutional:      Appearance: He is normal weight. He is diaphoretic.  HENT:     Head: Normocephalic and atraumatic.     Right Ear: External ear normal.     Left Ear: External ear normal.     Nose: Nose normal.     Mouth/Throat:     Mouth: Mucous membranes are moist.     Comments: Superficial laceration to the left lip. Eyes:     General: No scleral icterus.    Extraocular Movements: Extraocular movements intact.     Pupils: Pupils are equal, round, and reactive to light.  Neck:     Comments: C-collar in place. Cardiovascular:     Rate and Rhythm: Normal rate and regular rhythm.     Heart sounds: Normal heart sounds. No murmur heard.   No  friction rub. No gallop.  Pulmonary:     Effort: Pulmonary effort is normal. No respiratory distress.     Breath sounds: Normal breath sounds. No stridor. No wheezing, rhonchi or rales.  Abdominal:     Palpations: Abdomen is soft.     Tenderness: There is no abdominal tenderness. There is no guarding or rebound.  Musculoskeletal:        General: No deformity.     Cervical back: Neck supple. No tenderness.  Skin:    General: Skin is warm.  Neurological:     Mental Status: He is alert.     Sensory: No sensory deficit.     Comments: Patient is oriented to self and place.  He is able to move all extremities spontaneously.    ED Results / Procedures / Treatments   Labs (all labs ordered are listed, but only abnormal results are displayed) Labs Reviewed  CBC WITH DIFFERENTIAL/PLATELET - Abnormal; Notable for the following components:      Result Value   WBC 10.8 (*)    Hemoglobin 12.8 (*)    RDW 16.7 (*)    Monocytes Absolute 1.6 (*)    All other components within normal limits  COMPREHENSIVE METABOLIC PANEL - Abnormal; Notable for the following components:   Potassium 3.1 (*)    Chloride 94 (*)    CO2 16 (*)    Glucose, Bld 114 (*)    Creatinine, Ser 1.30 (*)    Total Protein 9.4 (*)    AST 97 (*)    ALT 63 (*)    Total Bilirubin 1.5 (*)    Anion gap 26 (*)    All other components within normal limits  CBG MONITORING, ED - Abnormal; Notable for the following components:   Glucose-Capillary 115 (*)    All other components within normal limits  I-STAT CHEM 8, ED - Abnormal; Notable for the following components:   Potassium 3.1 (*)    Chloride 96 (*)    Glucose, Bld 109 (*)    TCO2 19 (*)    All other components within normal limits  URINALYSIS, ROUTINE W REFLEX MICROSCOPIC  RAPID URINE DRUG SCREEN, HOSP PERFORMED    EKG None  Radiology CT HEAD WO CONTRAST ( )  Result Date: 04/23/2022 CLINICAL DATA:  New onset seizure.  Fall striking head. EXAM: CT HEAD WITHOUT  CONTRAST TECHNIQUE: Contiguous axial images were obtained from the base of the skull through the vertex without intravenous contrast. RADIATION DOSE REDUCTION: This exam was performed according to the departmental dose-optimization program which includes automated exposure control, adjustment of the mA and/or kV according to patient size and/or use of iterative reconstruction  technique. COMPARISON:  Remote head CT 10/03/2013 FINDINGS: Brain: No intracranial hemorrhage, mass effect, or midline shift. No hydrocephalus. The basilar cisterns are patent. No evidence of territorial infarct or acute ischemia. No extra-axial or intracranial fluid collection. Vascular: No hyperdense vessel or unexpected calcification. Skull: Normal. Negative for fracture or focal lesion. Sinuses/Orbits: Lesional opacification of scattered ethmoid air cells. No sinus fluid levels. Unremarkable orbits. No mastoid effusion. Other: None. IMPRESSION: Negative head CT. Electronically Signed   By: Narda Rutherford M.D.   On: 04/23/2022 21:26   CT Cervical Spine Wo Contrast  Result Date: 04/23/2022 CLINICAL DATA:  Neck trauma, intoxicated or obtunded (Age >= 16y) Fall, seizure EXAM: CT CERVICAL SPINE WITHOUT CONTRAST TECHNIQUE: Multidetector CT imaging of the cervical spine was performed without intravenous contrast. Multiplanar CT image reconstructions were also generated. RADIATION DOSE REDUCTION: This exam was performed according to the departmental dose-optimization program which includes automated exposure control, adjustment of the mA and/or kV according to patient size and/or use of iterative reconstruction technique. COMPARISON:  None Available. FINDINGS: Alignment: Straightening of normal lordosis. No traumatic subluxation. Skull base and vertebrae: No acute fracture. Vertebral body heights are maintained. The dens and skull base are intact. Soft tissues and spinal canal: No prevertebral fluid or swelling. No visible canal hematoma. Disc  levels:  Suspected minor disc bulge at C6-C7. Upper chest: No acute or unexpected findings. Other: None. IMPRESSION: Straightening of normal lordosis may be due to positioning or muscle spasm. No acute fracture or subluxation. Electronically Signed   By: Narda Rutherford M.D.   On: 04/23/2022 21:30    Procedures Procedures    Medications Ordered in ED Medications  lactated ringers bolus 1,000 mL (0 mLs Intravenous Stopped 04/23/22 2133)  potassium chloride SA (KLOR-CON M) CR tablet 40 mEq (40 mEq Oral Given 04/23/22 1917)  LORazepam (ATIVAN) tablet 1 mg (1 mg Oral Given 04/23/22 1933)  chlordiazePOXIDE (LIBRIUM) capsule 50 mg (50 mg Oral Given 04/23/22 2201)  acetaminophen (TYLENOL) tablet 1,000 mg (1,000 mg Oral Given 04/23/22 2201)    ED Course/ Medical Decision Making/ A&P                           Medical Decision Making Risk OTC drugs. Prescription drug management.   31 year old male with no pertinent past medical history presenting to the ED after a seizure.  On exam, the patient is afebrile and hemodynamically stable.  He does appear confused and is only oriented to self and place, likely postictal from the seizure he experienced.  Patient denies any history of seizures and denies taking any medications for seizures.  He is able to move all extremities spontaneously and has no sensory deficits.  On chart review, it does appear the patient was seen about 10 years ago for a possible seizure, but patient does not remember this incident.  On reevaluation, patient is much more coherent and appears to be back to his baseline.  He does note that he drinks daily (about 40 ounces of beer) and his last drink was about 2 days ago.  He is not on any medications for seizures.  Differential diagnosis includes head trauma, electrolyte abnormality, alcohol withdrawal seizures, epilepsy, hypoglycemia.  Given the patient did have a witnessed fall, CT head and CT C-spine were obtained.  CT head and CT  C-spine are negative for acute traumatic abnormality.  POCT glucose on arrival was 115.  CBC notable for a mild leukocytosis with a WBC of  10.8, likely reactive due to his seizure given that he does not have any infectious symptoms or fever.  Hemoglobin is 12.8 which appears to be his baseline.  CMP with mild hypokalemia with a potassium of 3.1, given oral repletion.  His bicarb is low at 16, also likely due to his seizure.  His creatinine is mildly elevated to 1.3 and he was given 1 L of IV fluid while in the ED.  AST is elevated to 97, likely due to his alcohol use.  Given his largely negative work-up, I do believe the patient's seizure was likely from alcohol withdrawal.  He does state that he was possibly having some "hallucinations" just prior to this incident which are also consistent with this.  I have offered a Librium taper to the patient and he is amenable to this.  I did advise him that he is absolutely not able to drink while on the Librium taper, and he is agreeable to discontinue his alcohol use while taking the Librium.  He understands the risk of drinking while taking Librium.  He was given his first dose while in the ED and the Librium taper prescription was sent to his pharmacy.  I also recommended that the patient follow-up with his primary care doctor within the next week.  Strict return precautions were discussed and the patient was discharged home in stable condition.        Final Clinical Impression(s) / ED Diagnoses Final diagnoses:  Seizure (HCC)  Alcohol withdrawal seizure without complication [facility])    Rx / DC Orders ED Discharge Orders          Ordered    chlordiazePOXIDE (LIBRIUM) 25 MG capsule        04/23/22 2150              Laurence Compton, MD 04/23/22 2220    Eber Hong, MD 04/24/22 1227

## 2022-04-23 NOTE — ED Notes (Signed)
Pt is AAOx4. Pt is ambulatory. Pt given bus pass.
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
## 2022-05-11 ENCOUNTER — Encounter (HOSPITAL_COMMUNITY): Payer: Self-pay

## 2022-05-11 ENCOUNTER — Ambulatory Visit (HOSPITAL_COMMUNITY)
Admission: EM | Admit: 2022-05-11 | Discharge: 2022-05-11 | Disposition: A | Discharge status: Home / Self Care | Payer: Self-pay

## 2022-05-11 DIAGNOSIS — R569 Unspecified convulsions: Secondary | ICD-10-CM

## 2022-05-11 DIAGNOSIS — Z0289 Encounter for other administrative examinations: Secondary | ICD-10-CM
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
## 2022-05-30 ENCOUNTER — Encounter (HOSPITAL_COMMUNITY): Payer: Self-pay

## 2022-05-30 ENCOUNTER — Other Ambulatory Visit: Payer: Self-pay

## 2022-05-30 ENCOUNTER — Emergency Department (HOSPITAL_COMMUNITY)
Admission: EM | Admit: 2022-05-30 | Discharge: 2022-05-31 | Disposition: A | Discharge status: Home / Self Care | Payer: Self-pay | Attending: Emergency Medicine | Admitting: Emergency Medicine

## 2022-05-30 DIAGNOSIS — F10120 Alcohol abuse with intoxication, uncomplicated: Secondary | ICD-10-CM | POA: Insufficient documentation

## 2022-05-30 DIAGNOSIS — Z79899 Other long term (current) drug therapy: Secondary | ICD-10-CM | POA: Insufficient documentation

## 2022-05-30 DIAGNOSIS — Y908 Blood alcohol level of 240 mg/100 ml or more: Secondary | ICD-10-CM | POA: Insufficient documentation

## 2022-05-30 DIAGNOSIS — F10920 Alcohol use, unspecified with intoxication, uncomplicated: Secondary | ICD-10-CM

## 2022-05-30 LAB — CBC WITH DIFFERENTIAL/PLATELET
Abs Immature Granulocytes: 0.01 10*3/uL (ref 0.00–0.07)
Basophils Absolute: 0.1 10*3/uL (ref 0.0–0.1)
Basophils Relative: 1 %
Eosinophils Absolute: 0 10*3/uL (ref 0.0–0.5)
Eosinophils Relative: 0 %
HCT: 38.4 % — ABNORMAL LOW (ref 39.0–52.0)
Hemoglobin: 13.1 g/dL (ref 13.0–17.0)
Immature Granulocytes: 0 %
Lymphocytes Relative: 21 %
Lymphs Abs: 1.2 10*3/uL (ref 0.7–4.0)
MCH: 26.6 pg (ref 26.0–34.0)
MCHC: 34.1 g/dL (ref 30.0–36.0)
MCV: 77.9 fL — ABNORMAL LOW (ref 80.0–100.0)
Monocytes Absolute: 0.4 10*3/uL (ref 0.1–1.0)
Monocytes Relative: 7 %
Neutro Abs: 3.9 10*3/uL (ref 1.7–7.7)
Neutrophils Relative %: 71 %
Platelets: 315 10*3/uL (ref 150–400)
RBC: 4.93 MIL/uL (ref 4.22–5.81)
RDW: 15.5 % (ref 11.5–15.5)
WBC: 5.6 10*3/uL (ref 4.0–10.5)
nRBC: 0 % (ref 0.0–0.2)

## 2022-05-30 LAB — COMPREHENSIVE METABOLIC PANEL
ALT: 90 U/L — ABNORMAL HIGH (ref 0–44)
AST: 144 U/L — ABNORMAL HIGH (ref 15–41)
Albumin: 4.7 g/dL (ref 3.5–5.0)
Alkaline Phosphatase: 52 U/L (ref 38–126)
Anion gap: 11 (ref 5–15)
BUN: 8 mg/dL (ref 6–20)
CO2: 27 mmol/L (ref 22–32)
Calcium: 9.3 mg/dL (ref 8.9–10.3)
Chloride: 103 mmol/L (ref 98–111)
Creatinine, Ser: 0.86 mg/dL (ref 0.61–1.24)
GFR, Estimated: >60 mL/min (ref >60)
Glucose, Bld: 107 mg/dL — ABNORMAL HIGH (ref 70–99)
Potassium: 3.4 mmol/L — ABNORMAL LOW (ref 3.5–5.1)
Sodium: 141 mmol/L (ref 135–145)
Total Bilirubin: 0.7 mg/dL (ref 0.3–1.2)
Total Protein: 8.9 g/dL — ABNORMAL HIGH (ref 6.5–8.1)

## 2022-05-30 LAB — ETHANOL: Alcohol, Ethyl (B): 461 mg/dL (ref <10)

## 2022-05-30 MED ORDER — THIAMINE HCL 100 MG/ML IJ SOLN: 100.0000 mg | Freq: Every day | INTRAMUSCULAR | Status: DC

## 2022-05-30 MED ORDER — LORAZEPAM 1 MG PO TABS: 0.0000 mg | ORAL_TABLET | Freq: Two times a day (BID) | ORAL | Status: DC

## 2022-05-30 MED ORDER — LORAZEPAM 2 MG/ML IJ SOLN: 0.0000 mg | Freq: Two times a day (BID) | INTRAMUSCULAR | Status: DC

## 2022-05-30 MED ORDER — THIAMINE HCL 100 MG PO TABS: 100.0000 mg | ORAL_TABLET | Freq: Every day | ORAL | Status: DC

## 2022-05-30 MED ORDER — LORAZEPAM 2 MG/ML IJ SOLN: 0.0000 mg | Freq: Four times a day (QID) | INTRAMUSCULAR | Status: DC

## 2022-05-30 MED ORDER — LORAZEPAM 1 MG PO TABS: 0.0000 mg | ORAL_TABLET | Freq: Four times a day (QID) | ORAL | Status: DC

## 2022-05-30 NOTE — ED Provider Triage Note (Signed)
Emergency Medicine Provider Triage Evaluation Note  Jose Hernandez , a 31 y.o. male  was evaluated in triage.  Pt complains of alcohol abuse. Pt requesting alcohol detox due to having withdrawal seizure in the past while trying to detox.  Last drink this AM.  Denies SI/HI  Review of Systems  Positive: As above Negative: As above  Physical Exam  BP 136/79   Pulse (!) 102   Temp 99.1 F (37.3 C) (Oral)   Resp 18   Ht 5\' 9"  (1.753 m)   Wt 79.4 kg   SpO2 95%   BMI 25.84 kg/m  Gen:   Awake, no distress   Resp:  Normal effort  MSK:   Moves extremities without difficulty  Other:    Medical Decision Making  Medically screening exam initiated at 7:07 PM.  Appropriate orders placed.  [name] How was informed that the remainder of the evaluation will be completed by another provider, this initial triage assessment does not replace that evaluation, and the importance of remaining in the ED until their evaluation is complete.     , PA-C 05/30/22 1910

## 2022-05-30 NOTE — ED Triage Notes (Addendum)
Patient is requesting detox from alcohol and states he has seizures when trying t detox. Patient denies any SI/HI, visual or auditory hallucinations.  Patient states his last drink was this AM and reports that that he drank a 40 ounce beer, and 4 Locos, and a fifth of liquor.

## 2022-05-30 NOTE — ED Provider Notes (Signed)
[facility]EMERGENCY DEPT Provider Note   CSN: [id number] Arrival date & time: 05/30/22  1810     History {Add pertinent medical, surgical, social history, OB history to HPI:1} No chief complaint on file.   Fredrik L Lesniak is a 31 y.o. male.  HPI     This is a 31 year old male with a history of alcohol abuse and withdrawal who presents with alcohol intoxication and requesting detox.  Patient reports that he is homeless and wants detox.  He states he has attempted detox as an outpatient but has been unsuccessful.  He denies SI or HI.  Denies other drug use.  Reports last drinking alcohol prior to arrival here today.  Patient was seen and evaluated in mid June after having witnessed seizure.  At that time it was thought that this may be related to alcohol cessation.  Neurology was consulted at that time and he was treated with Librium as an outpatient.  Home Medications Prior to Admission medications   Medication Sig Start Date End Date Taking? Authorizing Provider  chlordiazePOXIDE (LIBRIUM) 25 MG capsule 50mg  PO TID x 1D, then 50mg  PO BID X 1D, then 50mg  PO QD X 1D, then 25mg  PO x1D 04/23/22   , MD  ciprofloxacin (CIPRO) 500 MG tablet Take 1 tablet (500 mg total) by mouth 2 (two) times daily. Patient not taking: No sig reported 05/04/16   , MD  diphenoxylate-atropine (LOMOTIL) 2.5-0.025 MG tablet Take 1 tablet by mouth 4 (four) times daily as needed for diarrhea or loose stools. Patient not taking: No sig reported 05/04/16   05/06/16, MD  docusate sodium (COLACE) 250 MG capsule Take 1 capsule (250 mg total) by mouth 2 (two) times daily as needed for constipation. Patient not taking: No sig reported 03/08/16   [birth year]1990, PA-C  HYDROcodone-acetaminophen (NORCO/VICODIN) 5-325 MG tablet Take 1-2 tablets by mouth every 6 (six) hours as needed for moderate pain or severe pain. Patient not taking: No sig reported 12/25/17   Robinson, Rolland Porter  N, PA-C  ibuprofen (ADVIL,MOTRIN) 600 MG tablet Take 1 tablet (600 mg total) by mouth every 6 (six) hours as needed. Patient not taking: Reported on 05/31/2018 11/07/16   02/22/18, MD  magic mouthwash w/lidocaine SOLN Take 5 mLs by mouth 3 (three) times daily as needed for mouth pain. Patient not taking: No sig reported 11/07/16   08/01/2018, MD  metroNIDAZOLE (FLAGYL) 500 MG tablet Take 1 tablet (500 mg total) by mouth 3 (three) times daily. Patient not taking: No sig reported 05/04/16   Derwood Kaplan, MD  naproxen (NAPROSYN) 500 MG tablet Take 1 tablet (500 mg total) by mouth 2 (two) times daily with a meal. Patient not taking: No sig reported 03/11/15   Derwood Kaplan, PA-C  oxyCODONE-acetaminophen (PERCOCET/ROXICET) 5-325 MG tablet Take 1-2 tablets by mouth every 6 (six) hours as needed for moderate pain. Patient not taking: No sig reported 03/08/16   Rolland Porter, PA-C      Allergies    Patient has no known allergies.    Review of Systems   Review of Systems  Neurological:  Negative for tremors.  All other systems reviewed and are negative.   Physical Exam Updated Vital Signs BP (!) 136/91   Pulse 83   Temp 98.2 F (36.8 C)   Resp 20   Ht 1.753 m (5\' 9" )   Wt 79.4 kg   SpO2 95%   BMI 25.84 kg/m  Physical Exam  Vitals and nursing note reviewed.  Constitutional:      Appearance: He is well-developed.     Comments: Disheveled appearing, intoxicated  HENT:     Head: Normocephalic and atraumatic.  Eyes:     Pupils: Pupils are equal, round, and reactive to light.  Cardiovascular:     Rate and Rhythm: Normal rate and regular rhythm.     Heart sounds: Normal heart sounds.  Pulmonary:     Effort: Pulmonary effort is normal. No respiratory distress.     Breath sounds: Normal breath sounds.  Abdominal:     General: Bowel sounds are normal.     Palpations: Abdomen is soft.     Tenderness: There is no abdominal tenderness. There is no rebound.  Musculoskeletal:      Cervical back: Neck supple.  Lymphadenopathy:     Cervical: No cervical adenopathy.  Skin:    General: Skin is warm and dry.  Neurological:     Mental Status: He is alert and oriented to person, place, and time.  Psychiatric:        Mood and Affect: Mood normal.     ED Results / Procedures / Treatments   Labs (all labs ordered are listed, but only abnormal results are displayed) Labs Reviewed  COMPREHENSIVE METABOLIC PANEL - Abnormal; Notable for the following components:      Result Value   Potassium 3.4 (*)    Glucose, Bld 107 (*)    Total Protein 8.9 (*)    AST 144 (*)    ALT 90 (*)    All other components within normal limits  CBC WITH DIFFERENTIAL/PLATELET - Abnormal; Notable for the following components:   HCT 38.4 (*)    MCV 77.9 (*)    All other components within normal limits  ETHANOL - Abnormal; Notable for the following components:   Alcohol, Ethyl (B) 461 (*)    All other components within normal limits  RAPID URINE DRUG SCREEN, HOSP PERFORMED    EKG None  Radiology No results found.  Procedures Procedures  {Document cardiac monitor, telemetry assessment procedure when appropriate:1}  Medications Ordered in ED Medications  LORazepam (ATIVAN) injection 0-4 mg ( Intravenous Not Given 05/30/22 2322)    Or  LORazepam (ATIVAN) tablet 0-4 mg ( Oral See Alternative 05/30/22 2322)  LORazepam (ATIVAN) injection 0-4 mg (has no administration in time range)    Or  LORazepam (ATIVAN) tablet 0-4 mg (has no administration in time range)  thiamine tablet 100 mg (has no administration in time range)    Or  thiamine (B-1) injection 100 mg (has no administration in time range)    ED Course/ Medical Decision Making/ A&P                           Medical Decision Making Risk OTC drugs. Prescription drug management.   ***  {Document critical care time when appropriate:1} {Document review of labs and clinical decision tools ie heart score, Chads2Vasc2 etc:1}   {Document your independent review of radiology images, and any outside records:1} {Document your discussion with family members, caretakers, and with consultants:1} {Document social determinants of health affecting pt's care:1} {Document your decision making why or why not admission, treatments were needed:1} Final Clinical Impression(s) / ED Diagnoses Final diagnoses:  None    Rx / DC Orders ED Discharge Orders     None

## 2022-05-31 MED ORDER — CHLORDIAZEPOXIDE HCL 25 MG PO CAPS: ORAL_CAPSULE | ORAL | 0 refills | Status: DC

## 2022-05-31 NOTE — Discharge Instructions (Addendum)
You were seen today requesting alcohol detox.  You do not meet criteria for inpatient detox.  You are acutely intoxicated.  You will be given a prescription for Librium if you choose to stop drinking alcohol.  Use this prescription to prevent severe withdrawal symptoms.  You were given resources for both shelters and detox facilities.

## 2022-05-31 NOTE — ED Notes (Signed)
Pt ambulated to restroom and back independently. Pt A&O x4

## 2022-06-01 ENCOUNTER — Emergency Department (HOSPITAL_COMMUNITY)
Admission: EM | Admit: 2022-06-01 | Discharge: 2022-06-01 | Disposition: A | Discharge status: Home / Self Care | Payer: Self-pay | Attending: Emergency Medicine | Admitting: Emergency Medicine

## 2022-06-01 ENCOUNTER — Other Ambulatory Visit: Payer: Self-pay

## 2022-06-01 ENCOUNTER — Encounter (HOSPITAL_COMMUNITY): Payer: Self-pay

## 2022-06-01 DIAGNOSIS — F17210 Nicotine dependence, cigarettes, uncomplicated: Secondary | ICD-10-CM | POA: Insufficient documentation

## 2022-06-01 DIAGNOSIS — Z5900 Homelessness unspecified: Secondary | ICD-10-CM | POA: Insufficient documentation

## 2022-06-01 NOTE — ED Provider Notes (Signed)
[facility] Emergency Department Provider Note MRN:  [id number]  Arrival date & time: 06/01/22     Chief Complaint   Homeless   History of Present Illness   Jose Hernandez is a 31 y.o. year-old male with no pertinent past medical presenting to the ED with chief complaint of homeless.  Patient apparently has been sleeping in the lobby of the emergency department for the past 2 days, ever since being discharged on the 11th.  He was told that he would have to leave but he said he did not have anywhere else to go and so he said he will check back in.  He has checked back then.  He feels like he is going to have a seizure.  Has been feeling this way all day.  Review of Systems  A thorough review of systems was obtained and all systems are negative except as noted in the HPI and PMH.   Patient's Health History    Past Medical History:  Diagnosis Date   Abscess    Anemia    History of epistaxis    years ago    Past Surgical History:  Procedure Laterality Date   INCISION AND DRAINAGE PERIRECTAL ABSCESS N/A 03/07/2016   Procedure: IRRIGATION AND DEBRIDEMENT PERIRECTAL ABSCESS;  Surgeon: Manus Rudd, MD;  Location: [facility] OR;  Service: General;  Laterality: N/A;    History reviewed. No pertinent family history.  Social History   Socioeconomic History   Marital status: Single    Spouse name: Not on file   Number of children: Not on file   Years of education: Not on file   Highest education level: Not on file  Occupational History   Not on file  Tobacco Use   Smoking status: Some Days    Types: Cigarettes   Smokeless tobacco: Never  Vaping Use   Vaping Use: Never used  Substance and Sexual Activity   Alcohol use: Yes    Alcohol/week: 3.0 standard drinks of alcohol    Types: 3 Cans of beer per week   Drug use: No   Sexual activity: Not on file  Other Topics Concern   Not on file  Social History Narrative   Not on file   Social Determinants of  Health   Financial Resource Strain: Not on file  Food Insecurity: Not on file  Transportation Needs: Not on file  Physical Activity: Not on file  Stress: Not on file  Social Connections: Not on file  Intimate Partner Violence: Not on file     Physical Exam   Vitals:   06/01/22 0228  BP: (!) 163/115  Pulse: 64  Resp: 19  Temp: 98.1 F (36.7 C)  SpO2: 92%    CONSTITUTIONAL: Well-appearing, NAD NEURO/PSYCH:  Alert and oriented x 3, no focal deficits EYES:  eyes equal and reactive ENT/NECK:  no LAD, no JVD CARDIO: Regular rate, well-perfused, normal S1 and S2 PULM:  CTAB no wheezing or rhonchi GI/GU:  non-distended, non-tender MSK/SPINE:  No gross deformities, no edema SKIN:  no rash, atraumatic   *Additional and/or pertinent findings included in MDM below  Diagnostic and Interventional Summary    EKG Interpretation  Date/Time:    Ventricular Rate:    PR Interval:    QRS Duration:   QT Interval:    QTC Calculation:   R Axis:     Text Interpretation:         Labs Reviewed - No data to display  No orders  to display    Medications - No data to display   Procedures  /  Critical Care Procedures  ED Course and Medical Decision Making  Initial Impression and Ddx Suspect patient is expressing malaise and feeling like he is going to have a seizure for secondary gain, namely a place to rest.  Vital signs normal, no signs of active withdrawal, no neurological deficits, no signs of obvious trauma.  Anticipating discharge.  Past medical/surgical history that increases complexity of ED encounter:  none  Interpretation of Diagnostics Laboratory and/or imaging options to aid in the diagnosis/care of the patient were considered.  After careful history and physical examination, it was determined that there was no indication for diagnostics at this time.  Patient Reassessment and Ultimate Disposition/Management     Discharge  Patient management required discussion  with the following services or consulting groups:  None  Complexity of Problems Addressed Acute uncomplicated illness or injury with no diagnostics  Additional Data Reviewed and Analyzed Further history obtained from: None  Additional Factors Impacting ED Encounter Risk SDOH impact on management  Elmer Sow. Pilar Plate, MD [facility] Emergency Medicine [facility] [email or web address]  Final Clinical Impressions(s) / ED Diagnoses     ICD-10-CM   1. Homelessness  Z59.00       ED Discharge Orders     None        Discharge Instructions Discussed with and Provided to Patient:     Discharge Instructions      You were evaluated in the Emergency Department and after careful evaluation, we did not find any emergent condition requiring admission or further testing in the hospital.  Your exam/testing today was overall reassuring.  Please return to the Emergency Department if you experience any worsening of your condition.  Thank you for allowing [place] to be a part of your care.        Sabas Sous, MD 06/01/22 [phone]

## 2022-06-01 NOTE — ED Triage Notes (Addendum)
Pt found to be sleeping in the lobby for 2 days after being dc'd on the 11th. Pt informed he would have to leave lobby. Pt states he does not have a place to go. Pt then stated he would just check back in. Reports he might have a seizure. No evidence of seizure like activity at this time.

## 2022-06-01 NOTE — ED Notes (Signed)
Pt care taken, no complaints at this time. 

## 2022-06-01 NOTE — Discharge Instructions (Signed)
You were evaluated in the Emergency Department and after careful evaluation, we did not find any emergent condition requiring admission or further testing in the hospital.  Your exam/testing today was overall reassuring.  Please return to the Emergency Department if you experience any worsening of your condition.  Thank you for allowing us to be a part of your care.  

## 2022-06-01 NOTE — ED Notes (Signed)
Pt ambulatory without assistance.  

## 2022-07-19 ENCOUNTER — Inpatient Hospital Stay (HOSPITAL_COMMUNITY)
Admission: EM | Admit: 2022-07-19 | Discharge: 2022-07-22 | DRG: 025 | Disposition: A | Discharge status: Home / Self Care | Payer: 59 | Attending: Neurosurgery | Admitting: Neurosurgery

## 2022-07-19 ENCOUNTER — Encounter (HOSPITAL_COMMUNITY): Payer: Self-pay

## 2022-07-19 ENCOUNTER — Emergency Department (HOSPITAL_COMMUNITY): Payer: 59

## 2022-07-19 ENCOUNTER — Other Ambulatory Visit: Payer: Self-pay

## 2022-07-19 DIAGNOSIS — G40909 Epilepsy, unspecified, not intractable, without status epilepticus: Secondary | ICD-10-CM | POA: Diagnosis present

## 2022-07-19 DIAGNOSIS — R402142 Coma scale, eyes open, spontaneous, at arrival to emergency department: Secondary | ICD-10-CM | POA: Diagnosis present

## 2022-07-19 DIAGNOSIS — I609 Nontraumatic subarachnoid hemorrhage, unspecified: Secondary | ICD-10-CM

## 2022-07-19 DIAGNOSIS — I6203 Nontraumatic chronic subdural hemorrhage: Secondary | ICD-10-CM | POA: Diagnosis not present

## 2022-07-19 DIAGNOSIS — F10129 Alcohol abuse with intoxication, unspecified: Secondary | ICD-10-CM

## 2022-07-19 DIAGNOSIS — Y92099 Unspecified place in other non-institutional residence as the place of occurrence of the external cause: Secondary | ICD-10-CM

## 2022-07-19 DIAGNOSIS — R402362 Coma scale, best motor response, obeys commands, at arrival to emergency department: Secondary | ICD-10-CM | POA: Diagnosis present

## 2022-07-19 DIAGNOSIS — Z79899 Other long term (current) drug therapy: Secondary | ICD-10-CM | POA: Diagnosis not present

## 2022-07-19 DIAGNOSIS — F1010 Alcohol abuse, uncomplicated: Secondary | ICD-10-CM

## 2022-07-19 DIAGNOSIS — D509 Iron deficiency anemia, unspecified: Secondary | ICD-10-CM | POA: Diagnosis present

## 2022-07-19 DIAGNOSIS — F17210 Nicotine dependence, cigarettes, uncomplicated: Secondary | ICD-10-CM | POA: Diagnosis present

## 2022-07-19 DIAGNOSIS — I6200 Nontraumatic subdural hemorrhage, unspecified: Secondary | ICD-10-CM | POA: Diagnosis present

## 2022-07-19 DIAGNOSIS — R569 Unspecified convulsions: Secondary | ICD-10-CM | POA: Diagnosis present

## 2022-07-19 DIAGNOSIS — R402252 Coma scale, best verbal response, oriented, at arrival to emergency department: Secondary | ICD-10-CM | POA: Diagnosis present

## 2022-07-19 DIAGNOSIS — Z5901 Sheltered homelessness: Secondary | ICD-10-CM

## 2022-07-19 DIAGNOSIS — E538 Deficiency of other specified B group vitamins: Secondary | ICD-10-CM | POA: Diagnosis present

## 2022-07-19 DIAGNOSIS — S065XAA Traumatic subdural hemorrhage with loss of consciousness status unknown, initial encounter: Secondary | ICD-10-CM | POA: Diagnosis present

## 2022-07-19 DIAGNOSIS — X58XXXA Exposure to other specified factors, initial encounter: Secondary | ICD-10-CM | POA: Diagnosis present

## 2022-07-19 DIAGNOSIS — D649 Anemia, unspecified: Secondary | ICD-10-CM | POA: Diagnosis not present

## 2022-07-19 DIAGNOSIS — Z91148 Patient's other noncompliance with medication regimen for other reason: Secondary | ICD-10-CM | POA: Diagnosis not present

## 2022-07-19 DIAGNOSIS — F10229 Alcohol dependence with intoxication, unspecified: Secondary | ICD-10-CM | POA: Diagnosis present

## 2022-07-19 DIAGNOSIS — G9341 Metabolic encephalopathy: Secondary | ICD-10-CM | POA: Diagnosis present

## 2022-07-19 HISTORY — DX: Nontraumatic subdural hemorrhage, unspecified: I62.00

## 2022-07-19 LAB — CBC WITH DIFFERENTIAL/PLATELET
Abs Immature Granulocytes: 0.02 10*3/uL (ref 0.00–0.07)
Basophils Absolute: 0 10*3/uL (ref 0.0–0.1)
Basophils Relative: 1 %
Eosinophils Absolute: 0 10*3/uL (ref 0.0–0.5)
Eosinophils Relative: 0 %
HCT: 34.6 % — ABNORMAL LOW (ref 39.0–52.0)
Hemoglobin: 11.6 g/dL — ABNORMAL LOW (ref 13.0–17.0)
Immature Granulocytes: 0 %
Lymphocytes Relative: 19 %
Lymphs Abs: 1 10*3/uL (ref 0.7–4.0)
MCH: 26.8 pg (ref 26.0–34.0)
MCHC: 33.5 g/dL (ref 30.0–36.0)
MCV: 79.9 fL — ABNORMAL LOW (ref 80.0–100.0)
Monocytes Absolute: 0.3 10*3/uL (ref 0.1–1.0)
Monocytes Relative: 6 %
Neutro Abs: 3.8 10*3/uL (ref 1.7–7.7)
Neutrophils Relative %: 74 %
Platelets: 249 10*3/uL (ref 150–400)
RBC: 4.33 MIL/uL (ref 4.22–5.81)
RDW: 15.3 % (ref 11.5–15.5)
WBC: 5.2 10*3/uL (ref 4.0–10.5)
nRBC: 0 % (ref 0.0–0.2)

## 2022-07-19 LAB — COMPREHENSIVE METABOLIC PANEL
ALT: 24 U/L (ref 0–44)
AST: 45 U/L — ABNORMAL HIGH (ref 15–41)
Albumin: 4.1 g/dL (ref 3.5–5.0)
Alkaline Phosphatase: 41 U/L (ref 38–126)
Anion gap: 10 (ref 5–15)
BUN: 7 mg/dL (ref 6–20)
CO2: 23 mmol/L (ref 22–32)
Calcium: 8.4 mg/dL — ABNORMAL LOW (ref 8.9–10.3)
Chloride: 108 mmol/L (ref 98–111)
Creatinine, Ser: 0.67 mg/dL (ref 0.61–1.24)
GFR, Estimated: >60 mL/min (ref >60)
Glucose, Bld: 99 mg/dL (ref 70–99)
Potassium: 3.8 mmol/L (ref 3.5–5.1)
Sodium: 141 mmol/L (ref 135–145)
Total Bilirubin: 1 mg/dL (ref 0.3–1.2)
Total Protein: 7.9 g/dL (ref 6.5–8.1)

## 2022-07-19 LAB — VALPROIC ACID LEVEL: Valproic Acid Lvl: <10 ug/mL — ABNORMAL LOW (ref 50.0–100.0)

## 2022-07-19 LAB — RAPID URINE DRUG SCREEN, HOSP PERFORMED
Amphetamines: NOT DETECTED
Barbiturates: NOT DETECTED
Benzodiazepines: NOT DETECTED
Cocaine: NOT DETECTED
Opiates: NOT DETECTED
Tetrahydrocannabinol: NOT DETECTED

## 2022-07-19 LAB — MAGNESIUM: Magnesium: 2 mg/dL (ref 1.7–2.4)

## 2022-07-19 LAB — CBG MONITORING, ED: Glucose-Capillary: 115 mg/dL — ABNORMAL HIGH (ref 70–99)

## 2022-07-19 LAB — HIV ANTIBODY (ROUTINE TESTING W REFLEX): HIV Screen 4th Generation wRfx: NONREACTIVE

## 2022-07-19 LAB — PHENYTOIN LEVEL, TOTAL: Phenytoin Lvl: <2.5 ug/mL — ABNORMAL LOW (ref 10.0–20.0)

## 2022-07-19 LAB — ETHANOL: Alcohol, Ethyl (B): 489 mg/dL (ref <10)

## 2022-07-19 LAB — IRON AND TIBC
Iron: 207 ug/dL — ABNORMAL HIGH (ref 45–182)
Saturation Ratios: 53 % — ABNORMAL HIGH (ref 17.9–39.5)
TIBC: 388 ug/dL (ref 250–450)
UIBC: 181 ug/dL

## 2022-07-19 MED ORDER — ONDANSETRON HCL 4 MG/2ML IJ SOLN: 4.0000 mg | Freq: Four times a day (QID) | INTRAMUSCULAR | Status: DC | PRN

## 2022-07-19 MED ORDER — THIAMINE MONONITRATE 100 MG PO TABS
100.0000 mg | ORAL_TABLET | Freq: Every day | ORAL | Status: DC
Start: 2022-07-19 — End: 2022-07-22
  Administered 2022-07-19 – 2022-07-22 (×4): 100 mg via ORAL
  Filled 2022-07-19 (×4): qty 1

## 2022-07-19 MED ORDER — ORAL CARE MOUTH RINSE
15.0000 mL | OROMUCOSAL | Status: DC
  Administered 2022-07-20 – 2022-07-22 (×11): 15 mL via OROMUCOSAL

## 2022-07-19 MED ORDER — ONDANSETRON HCL 4 MG PO TABS: 4.0000 mg | ORAL_TABLET | Freq: Four times a day (QID) | ORAL | Status: DC | PRN

## 2022-07-19 MED ORDER — LORAZEPAM 1 MG PO TABS
1.0000 mg | ORAL_TABLET | ORAL | Status: DC | PRN
  Administered 2022-07-21: 2 mg via ORAL
  Administered 2022-07-22: 1 mg via ORAL
  Filled 2022-07-19: qty 2
  Filled 2022-07-19: qty 1

## 2022-07-19 MED ORDER — THIAMINE HCL 100 MG/ML IJ SOLN
100.0000 mg | Freq: Every day | INTRAMUSCULAR | Status: DC
Start: 2022-07-19 — End: 2022-07-22

## 2022-07-19 MED ORDER — CALCIUM GLUCONATE-NACL 1-0.675 GM/50ML-% IV SOLN
1.0000 g | Freq: Once | INTRAVENOUS | Status: AC
  Administered 2022-07-19: 1000 mg via INTRAVENOUS
  Filled 2022-07-19: qty 50

## 2022-07-19 MED ORDER — LORAZEPAM 2 MG/ML IJ SOLN: 4.0000 mg | INTRAMUSCULAR | Status: DC | PRN

## 2022-07-19 MED ORDER — LEVETIRACETAM IN NACL 1000 MG/100ML IV SOLN
1000.0000 mg | INTRAVENOUS | Status: AC
  Administered 2022-07-19 (×2): 1000 mg via INTRAVENOUS
  Filled 2022-07-19: qty 100

## 2022-07-19 MED ORDER — LORAZEPAM 2 MG/ML IJ SOLN: 1.0000 mg | INTRAMUSCULAR | Status: DC | PRN

## 2022-07-19 MED ORDER — LEVETIRACETAM IN NACL 500 MG/100ML IV SOLN
500.0000 mg | Freq: Two times a day (BID) | INTRAVENOUS | Status: DC
  Administered 2022-07-20: 500 mg via INTRAVENOUS
  Filled 2022-07-19: qty 100

## 2022-07-19 MED ORDER — ORAL CARE MOUTH RINSE: 15.0000 mL | OROMUCOSAL | Status: DC | PRN

## 2022-07-19 MED ORDER — SODIUM CHLORIDE 0.9 % IV SOLN
75.0000 mL/h | INTRAVENOUS | Status: DC
  Administered 2022-07-19: 75 mL/h via INTRAVENOUS

## 2022-07-19 MED ORDER — LEVETIRACETAM 500 MG PO TABS
500.0000 mg | ORAL_TABLET | Freq: Two times a day (BID) | ORAL | Status: DC
  Administered 2022-07-20 – 2022-07-22 (×5): 500 mg via ORAL
  Filled 2022-07-19 (×8): qty 1

## 2022-07-19 MED ORDER — LEVETIRACETAM IN NACL 1000 MG/100ML IV SOLN: 2000.0000 mg | INTRAVENOUS | Status: DC

## 2022-07-19 MED ORDER — FOLIC ACID 1 MG PO TABS
1.0000 mg | ORAL_TABLET | Freq: Every day | ORAL | Status: DC
Start: 2022-07-19 — End: 2022-07-22
  Administered 2022-07-19 – 2022-07-22 (×4): 1 mg via ORAL
  Filled 2022-07-19 (×4): qty 1

## 2022-07-19 MED ORDER — SODIUM CHLORIDE 0.9 % IV SOLN
2000.0000 mg | Freq: Once | INTRAVENOUS | Status: DC
  Filled 2022-07-19: qty 20

## 2022-07-19 MED ORDER — LACTATED RINGERS IV SOLN: INTRAVENOUS | Status: DC

## 2022-07-19 MED ORDER — ADULT MULTIVITAMIN W/MINERALS CH
1.0000 | ORAL_TABLET | Freq: Every day | ORAL | Status: DC
Start: 2022-07-19 — End: 2022-07-22
  Administered 2022-07-19 – 2022-07-22 (×4): 1 via ORAL
  Filled 2022-07-19 (×4): qty 1

## 2022-07-19 NOTE — ED Notes (Signed)
Pt CBG was 115.

## 2022-07-19 NOTE — ED Provider Notes (Addendum)
[facility]-EMERGENCY DEPT Provider Note   CSN: [id number] Arrival date & time: 07/19/22  1136     History  Chief Complaint  Patient presents with   Seizures    Jose Hernandez is a 31 y.o. male.  31 year old male presents after having witnessed seizures at [facility].  Patient has history of seizure disorder.  He also has a history of alcohol abuse and last drink yesterday.  Patient had been prescribed Librium in the past.  Patient is arousable here and cannot tell me what seizure medication he was on.  EMS was called and patient blood sugar of 100 and patient transported here       Home Medications Prior to Admission medications   Medication Sig Start Date End Date Taking? Authorizing Provider  chlordiazePOXIDE (LIBRIUM) 25 MG capsule 50mg  PO TID x 1D, then 50mg  PO BID X 1D, then 50mg  PO QD X 1D, then 25mg  PO x1D 05/31/22   Horton, , MD  ciprofloxacin (CIPRO) 500 MG tablet Take 1 tablet (500 mg total) by mouth 2 (two) times daily. Patient not taking: No sig reported 05/04/16   , MD  diphenoxylate-atropine (LOMOTIL) 2.5-0.025 MG tablet Take 1 tablet by mouth 4 (four) times daily as needed for diarrhea or loose stools. Patient not taking: No sig reported 05/04/16   05/06/16, MD  docusate sodium (COLACE) 250 MG capsule Take 1 capsule (250 mg total) by mouth 2 (two) times daily as needed for constipation. Patient not taking: No sig reported 03/08/16   02-06-1991, PA-C  HYDROcodone-acetaminophen (NORCO/VICODIN) 5-325 MG tablet Take 1-2 tablets by mouth every 6 (six) hours as needed for moderate pain or severe pain. Patient not taking: No sig reported 12/25/17   Robinson, Rolland Porter N, PA-C  ibuprofen (ADVIL,MOTRIN) 600 MG tablet Take 1 tablet (600 mg total) by mouth every 6 (six) hours as needed. Patient not taking: Reported on 05/31/2018 11/07/16   02/22/18, MD  magic mouthwash w/lidocaine SOLN Take 5 mLs by mouth 3 (three) times daily as  needed for mouth pain. Patient not taking: No sig reported 11/07/16   08/01/2018, MD  metroNIDAZOLE (FLAGYL) 500 MG tablet Take 1 tablet (500 mg total) by mouth 3 (three) times daily. Patient not taking: No sig reported 05/04/16   Derwood Kaplan, MD  naproxen (NAPROSYN) 500 MG tablet Take 1 tablet (500 mg total) by mouth 2 (two) times daily with a meal. Patient not taking: No sig reported 03/11/15   Derwood Kaplan, PA-C  oxyCODONE-acetaminophen (PERCOCET/ROXICET) 5-325 MG tablet Take 1-2 tablets by mouth every 6 (six) hours as needed for moderate pain. Patient not taking: No sig reported 03/08/16   Rolland Porter, PA-C      Allergies    Patient has no known allergies.    Review of Systems   Review of Systems  Unable to perform ROS: Acuity of condition    Physical Exam Updated Vital Signs BP 129/75 (BP Location: Right Arm)   Pulse 93   Temp 98.5 F (36.9 C) (Oral)   Resp (!) 24   SpO2 98%  Physical Exam Vitals and nursing note reviewed.  Constitutional:      General: He is not in acute distress.    Appearance: Normal appearance. He is well-developed. He is not toxic-appearing.  HENT:     Head: Normocephalic and atraumatic.  Eyes:     General: Lids are normal.     Conjunctiva/sclera: Conjunctivae normal.  Pupils: Pupils are equal, round, and reactive to light.  Neck:     Thyroid: No thyroid mass.     Trachea: No tracheal deviation.  Cardiovascular:     Rate and Rhythm: Normal rate and regular rhythm.     Heart sounds: Normal heart sounds. No murmur heard.    No gallop.  Pulmonary:     Effort: Pulmonary effort is normal. No respiratory distress.     Breath sounds: Normal breath sounds. No stridor. No decreased breath sounds, wheezing, rhonchi or rales.  Abdominal:     General: There is no distension.     Palpations: Abdomen is soft.     Tenderness: There is no abdominal tenderness. There is no rebound.  Musculoskeletal:        General: No tenderness. Normal range  of motion.     Cervical back: Normal range of motion and neck supple.  Skin:    General: Skin is warm and dry.     Findings: No abrasion or rash.  Neurological:     Mental Status: He is alert and oriented to person, place, and time. Mental status is at baseline.     GCS: GCS eye subscore is 4. GCS verbal subscore is 5. GCS motor subscore is 6.     Cranial Nerves: No cranial nerve deficit.     Sensory: No sensory deficit.     Motor: Motor function is intact.  Psychiatric:        Attention and Perception: He is inattentive.        Speech: Speech is delayed.        Behavior: Behavior is withdrawn.     ED Results / Procedures / Treatments   Labs (all labs ordered are listed, but only abnormal results are displayed) Labs Reviewed  CBG MONITORING, ED - Abnormal; Notable for the following components:      Result Value   Glucose-Capillary 115 (*)    All other components within normal limits  CBC WITH DIFFERENTIAL/PLATELET  COMPREHENSIVE METABOLIC PANEL  RAPID URINE DRUG SCREEN, HOSP PERFORMED  ETHANOL    EKG EKG Interpretation  Date/Time:  Wednesday July 19 2022 11:46:15 EDT Ventricular Rate:  93 PR Interval:  154 QRS Duration: 93 QT Interval:  357 QTC Calculation: 444 R Axis:   94 Text Interpretation: Sinus rhythm Ventricular premature complex Aberrant complex Left atrial enlargement Borderline right axis deviation ST elev, probable normal early repol pattern Confirmed by Lorre Nick ([id number]) on 07/19/2022 12:06:52 PM  Radiology No results found.  Procedures Procedures    Medications Ordered in ED Medications  lactated ringers infusion (has no administration in time range)    ED Course/ Medical Decision Making/ A&P                           Medical Decision Making Amount and/or Complexity of Data Reviewed Labs: ordered. Radiology: ordered.  Risk Prescription drug management.  Patient's EKG shows normal sinus rhythm.  No ischemic changes noted. Patient  presented with altered mental status in the setting of alcohol intoxication.  Alcohol became back at 489.  Patient also had reported witnessed seizure.  Does have a history of seizures in the past.  Had a head CT which did show a subdural hematoma with some midline shift.  Patient is protecting his airway at this time.  Multiple reevaluations.  Is ANO x3.  Plan will be for neurosurgical consultation and likely admission.  3:03 PM Discussed case with  Dr. Yetta Barre from neurosurgery.  He has reviewed the films.  States that the subdural is likely about 47 weeks old or so.  Does not feel that there is any acute intervention needed but patient will require admission.  Will consult hospitalist for admission  CRITICAL CARE Performed by: Toy Baker Total critical care time: 50 minutes Critical care time was exclusive of separately billable procedures and treating other patients. Critical care was necessary to treat or prevent imminent or life-threatening deterioration. Critical care was time spent personally by me on the following activities: development of treatment plan with patient and/or surrogate as well as nursing, discussions with consultants, evaluation of patient's response to treatment, examination of patient, obtaining history from patient or surrogate, ordering and performing treatments and interventions, ordering and review of laboratory studies, ordering and review of radiographic studies, pulse oximetry and re-evaluation of patient's condition.         Final Clinical Impression(s) / ED Diagnoses Final diagnoses:  None    Rx / DC Orders ED Discharge Orders     None         Lorre Nick, MD 07/19/22 1450    Lorre Nick, MD 07/19/22 [phone]

## 2022-07-19 NOTE — ED Notes (Signed)
Pt brother Stana Bunting req call regarding pt condition if it changes. [phone]

## 2022-07-19 NOTE — H&P (Signed)
History and Physical    Patient: Jose Hernandez NFA:[id number] DOB: 09/30/1991 DOA: 07/19/2022 DOS: the patient was seen and examined on 07/19/2022 PCP: Pcp, No  Patient coming from: Home  Chief Complaint:  Chief Complaint  Patient presents with   Seizures   HPI: Jose Hernandez is a 31 y.o. male with medical history significant of EtOH abuse, seizures. Presenting with seizures. History per chart review as patient is confused and there is no family at bedside. He was at a shelter this morning and apparently had a witnessed seizure. It is unknown how long the seizure lasted. He apparently has not taken his seizure medication in a few days. EMS was alerted and he was brought to the ED for evaluation.   Review of Systems: Unable to review all systems due to lack of cooperation from patient. Past Medical History:  Diagnosis Date   Abscess    Anemia    History of epistaxis    years ago   Past Surgical History:  Procedure Laterality Date   INCISION AND DRAINAGE PERIRECTAL ABSCESS N/A 03/07/2016   Procedure: IRRIGATION AND DEBRIDEMENT PERIRECTAL ABSCESS;  Surgeon: Manus Rudd, MD;  Location: [facility] OR;  Service: General;  Laterality: N/A;   Social History:  reports that he has been smoking cigarettes. He has never used smokeless tobacco. He reports current alcohol use of about 3.0 standard drinks of alcohol per week. He reports that he does not use drugs.  No Known Allergies  History reviewed. No pertinent family history.  Prior to Admission medications   Medication Sig Start Date End Date Taking? Authorizing Provider  chlordiazePOXIDE (LIBRIUM) 25 MG capsule 50mg  PO TID x 1D, then 50mg  PO BID X 1D, then 50mg  PO QD X 1D, then 25mg  PO x1D 05/31/22   Horton, , MD  ciprofloxacin (CIPRO) 500 MG tablet Take 1 tablet (500 mg total) by mouth 2 (two) times daily. Patient not taking: No sig reported 05/04/16   , MD  diphenoxylate-atropine (LOMOTIL) 2.5-0.025 MG tablet Take  1 tablet by mouth 4 (four) times daily as needed for diarrhea or loose stools. Patient not taking: No sig reported 05/04/16   05/06/16, MD  docusate sodium (COLACE) 250 MG capsule Take 1 capsule (250 mg total) by mouth 2 (two) times daily as needed for constipation. Patient not taking: No sig reported 03/08/16   02-06-1991, PA-C  HYDROcodone-acetaminophen (NORCO/VICODIN) 5-325 MG tablet Take 1-2 tablets by mouth every 6 (six) hours as needed for moderate pain or severe pain. Patient not taking: No sig reported 12/25/17   Robinson, Rolland Porter N, PA-C  ibuprofen (ADVIL,MOTRIN) 600 MG tablet Take 1 tablet (600 mg total) by mouth every 6 (six) hours as needed. Patient not taking: Reported on 05/31/2018 11/07/16   02/22/18, MD  magic mouthwash w/lidocaine SOLN Take 5 mLs by mouth 3 (three) times daily as needed for mouth pain. Patient not taking: No sig reported 11/07/16   08/01/2018, MD  metroNIDAZOLE (FLAGYL) 500 MG tablet Take 1 tablet (500 mg total) by mouth 3 (three) times daily. Patient not taking: No sig reported 05/04/16   Derwood Kaplan, MD  naproxen (NAPROSYN) 500 MG tablet Take 1 tablet (500 mg total) by mouth 2 (two) times daily with a meal. Patient not taking: No sig reported 03/11/15   Derwood Kaplan, PA-C  oxyCODONE-acetaminophen (PERCOCET/ROXICET) 5-325 MG tablet Take 1-2 tablets by mouth every 6 (six) hours as needed for moderate pain. Patient not taking: No sig reported  03/08/16   Nonie Hoyer, PA-C    Physical Exam: Vitals:   07/19/22 1300 07/19/22 1430 07/19/22 1500 07/19/22 1542  BP: (!) 122/97 111/60 133/80 115/71  Pulse: 95 92 77 76  Resp: (!) 28 (!) 24 (!) 21 14  Temp:    98.4 F (36.9 C)  TempSrc:    Oral  SpO2: 96% 93% 97% 98%   General: 31 y.o. male resting in bed in NAD Eyes: PERRL, normal sclera ENMT: Nares patent w/o discharge, orophaynx clear, dentition normal, ears w/o discharge/lesions/ulcers Neck: in c-collar Cardiovascular: RRR, +S1, S2, no m/g/r,  equal pulses throughout Respiratory: CTABL, no w/r/r, normal WOB GI: BS+, NDNT, no masses noted, no organomegaly noted MSK: No e/c/c Neuro: GCS: E3, V3, M5; mumbling some words, resisting noxious stimuli  Data Reviewed:  Lab Results  Component Value Date   NA 141 07/19/2022   K 3.8 07/19/2022   CO2 23 07/19/2022   GLUCOSE 99 07/19/2022   BUN 7 07/19/2022   CREATININE 0.67 07/19/2022   CALCIUM 8.4 (L) 07/19/2022   GFRNONAA >60 07/19/2022   Lab Results  Component Value Date   WBC 5.2 07/19/2022   HGB 11.6 (L) 07/19/2022   HCT 34.6 (L) 07/19/2022   MCV 79.9 (L) 07/19/2022   PLT 249 07/19/2022   CTH: 1. Small left-sided, recent, subdural hemorrhage, with mild mass effect, shifting the midline to the right by 3-4 mm. No parenchymal or subarachnoid hemorrhage. 2. No other acute or recent intracranial abnormality.  Assessment and Plan: Acute metabolic encephalopathy Subdural hematoma Seizure?     - admit to inpt, progressive @ [facility]     - EDP spoke with neurosurgery; rec'd obs at [facility]; they will see him there     - EEG     - spoke with neurology, rec'd keppra load (2g) and keppra 500mg  BID, will see at [facility]     - Neuro checks  EtOH abuse     - CIWA  Microcytic anemia     - check iron studies  Hypocalcemia     - mild, replace Ca2+; check Mg2+  Advance Care Planning:   Code Status: FULL  Consults: Neurology (Dr. [facility]);  EDP spoke with Neurosurgery (Dr. Jobe Marker)  Family Communication: attempted call to brother, Yetta Barre, @ [phone] @ 1702hrs. Received VM only.  Severity of Illness: The appropriate patient status for this patient is OBSERVATION. Observation status is judged to be reasonable and necessary in order to provide the required intensity of service to ensure the patient's safety. The patient's presenting symptoms, physical exam findings, and initial radiographic and laboratory data in the context of their medical condition is felt to place them at  decreased risk for further clinical deterioration. Furthermore, it is anticipated that the patient will be medically stable for discharge from the hospital within 2 midnights of admission.   Time spent in coordination of this H&P: 84 minutes  Author: [phone], DO 07/19/2022 3:53 PM  For on call review [email or web address].

## 2022-07-19 NOTE — Consult Note (Signed)
NEUROLOGY CONSULTATION NOTE   Date of service: July 19, 2022 Patient Name: Jose Hernandez MRN:  [id number] DOB:  1991-05-22 Reason for consult: "Break through seizures in the setting of hx of EtOh use, medication non compliance and new since last visit but chronic appearing SDH on imaging" Requesting Provider: Teddy Spike, DO _ _ _   _ __   _ __ _ _  __ __   _ __   __ _  History of Present Illness  Jose Hernandez is a 31 y.o. male with PMH significant for EtOh use, withdrawal seizures in the past and on outpatient librium that he reports are for seizures who had a seizure while in the line at [employer]. He supposedly has not taken his librium in a few days. He also endorses drinking alcohol and reports "going hard at it" and unable to tell me how much alcohol he had. He was brought in to the ED where [facility] demonstrated small recent SDH with mild mass effect and midline shift 3-68mm. Case discussed with neurosurgery and he was transferred to [facility] for midlle meningeal artery embolization. Neurology consulted for evaluation and management of seizures.  Of note, also seems to be on Librium taper outpatient and EtOH levels here were 489 on presentation.   ROS   Constitutional Denies weight loss, fever and chills.   HEENT Denies changes in vision and hearing.   Respiratory Denies SOB and cough.   CV Denies palpitations and CP   GI Denies abdominal pain, nausea, vomiting and diarrhea.   GU Denies dysuria and urinary frequency.   MSK Denies myalgia and joint pain.   Skin Denies rash and pruritus.   Neurological Denies headache and syncope.   Psychiatric Denies recent changes in mood. Denies anxiety and depression.    Past History   Past Medical History:  Diagnosis Date   Abscess    Anemia    History of epistaxis    years ago   Past Surgical History:  Procedure Laterality Date   INCISION AND DRAINAGE PERIRECTAL ABSCESS N/A 03/07/2016   Procedure: IRRIGATION AND  DEBRIDEMENT PERIRECTAL ABSCESS;  Surgeon: Manus Rudd, MD;  Location: [facility] OR;  Service: General;  Laterality: N/A;   History reviewed. No pertinent family history. Social History   Socioeconomic History   Marital status: Single    Spouse name: Not on file   Number of children: Not on file   Years of education: Not on file   Highest education level: Not on file  Occupational History   Not on file  Tobacco Use   Smoking status: Some Days    Types: Cigarettes   Smokeless tobacco: Never  Vaping Use   Vaping Use: Never used  Substance and Sexual Activity   Alcohol use: Yes    Alcohol/week: 3.0 standard drinks of alcohol    Types: 3 Cans of beer per week   Drug use: No   Sexual activity: Not on file  Other Topics Concern   Not on file  Social History Narrative   Not on file   Social Determinants of Health   Financial Resource Strain: Not on file  Food Insecurity: Not on file  Transportation Needs: Not on file  Physical Activity: Not on file  Stress: Not on file  Social Connections: Not on file   No Known Allergies  Medications   Medications Prior to Admission  Medication Sig Dispense Refill Last Dose   chlordiazePOXIDE (LIBRIUM) 25 MG capsule 50mg  PO  TID x 1D, then 50mg  PO BID X 1D, then 50mg  PO QD X 1D, then 25mg  PO x1D 13 capsule 0    ciprofloxacin (CIPRO) 500 MG tablet Take 1 tablet (500 mg total) by mouth 2 (two) times daily. (Patient not taking: No sig reported) 20 tablet 0    diphenoxylate-atropine (LOMOTIL) 2.5-0.025 MG tablet Take 1 tablet by mouth 4 (four) times daily as needed for diarrhea or loose stools. (Patient not taking: No sig reported) 30 tablet 0    docusate sodium (COLACE) 250 MG capsule Take 1 capsule (250 mg total) by mouth 2 (two) times daily as needed for constipation. (Patient not taking: No sig reported)  0    HYDROcodone-acetaminophen (NORCO/VICODIN) 5-325 MG tablet Take 1-2 tablets by mouth every 6 (six) hours as needed for moderate pain or  severe pain. (Patient not taking: No sig reported) 8 tablet 0    ibuprofen (ADVIL,MOTRIN) 600 MG tablet Take 1 tablet (600 mg total) by mouth every 6 (six) hours as needed. (Patient not taking: Reported on 05/31/2018) 30 tablet 0    magic mouthwash w/lidocaine SOLN Take 5 mLs by mouth 3 (three) times daily as needed for mouth pain. (Patient not taking: No sig reported) 75 mL 0    metroNIDAZOLE (FLAGYL) 500 MG tablet Take 1 tablet (500 mg total) by mouth 3 (three) times daily. (Patient not taking: No sig reported) 30 tablet 0    naproxen (NAPROSYN) 500 MG tablet Take 1 tablet (500 mg total) by mouth 2 (two) times daily with a meal. (Patient not taking: No sig reported) 30 tablet 0    oxyCODONE-acetaminophen (PERCOCET/ROXICET) 5-325 MG tablet Take 1-2 tablets by mouth every 6 (six) hours as needed for moderate pain. (Patient not taking: No sig reported) 30 tablet 0      Vitals   Vitals:   07/19/22 1900 07/19/22 1930 07/19/22 1947 07/19/22 2107  BP: 112/69 109/71  111/75  Pulse: 70 67  71  Resp: 11 (!) 24  16  Temp:   97.7 F (36.5 C) 98.3 F (36.8 C)  TempSrc:   Oral Oral  SpO2: 96% 94%  99%     There is no height or weight on file to calculate BMI.  Physical Exam   General: Laying comfortably in bed; in no acute distress.  HENT: Normal oropharynx and mucosa. Normal external appearance of ears and nose.  Neck: Supple, no pain or tenderness  CV: No JVD. No peripheral edema.  Pulmonary: Symmetric Chest rise. Normal respiratory effort. Abdomen: Soft to touch, non-tender.  Ext: No cyanosis, edema, or deformity  Skin: No rash. Normal palpation of skin.   Musculoskeletal: Normal digits and nails by inspection. No clubbing.   Neurologic Examination  Mental status/Cognition: Alert, oriented to self, place, month and year, good attention.  Speech/language: Fluent, comprehension intact, object naming intact, repetition intact. Cranial nerves:   CN II Pupils equal and reactive to light, no  VF deficits    CN III,IV,VI EOM intact, no gaze preference or deviation, no nystagmus    CN V normal sensation in V1, V2, and V3 segments bilaterally    CN VII no asymmetry, no nasolabial fold flattening    CN VIII normal hearing to speech    CN IX & X normal palatal elevation, no uvular deviation    CN XI 5/5 head turn and 5/5 shoulder shrug bilaterally   CN XII midline tongue protrusion    Motor:  Muscle bulk: normal, tone normal, pronator drift none tremor  none Mvmt Root Nerve  Muscle Right Left Comments  SA C5/6 Ax Deltoid 5 5   EF C5/6 Mc Biceps 5 5   EE C6/7/8 Rad Triceps 5 5   WF C6/7 Med FCR     WE C7/8 PIN ECU     F Ab C8/T1 U ADM/FDI 5 5   HF L1/2/3 Fem Illopsoas 5 5   KE L2/3/4 Fem Quad 5 5   DF L4/5 D Peron Tib Ant 5 5   PF S1/2 Tibial Grc/Sol 5 5    Reflexes:  Right Left Comments  Pectoralis      Biceps (C5/6) 2 2   Brachioradialis (C5/6) 2 2    Triceps (C6/7) 2 2    Patellar (L3/4) 2 2    Achilles (S1)      Hoffman      Plantar     Jaw jerk    Sensation:  Light touch Intact throughout   Pin prick    Temperature    Vibration   Proprioception    Coordination/Complex Motor:  - Finger to Nose intact BL - Heel to shin declined. - Rapid alternating movement are normal - Gait: deferred for patient safety.  Labs   CBC:  Recent Labs  Lab 07/19/22 1220  WBC 5.2  NEUTROABS 3.8  HGB 11.6*  HCT 34.6*  MCV 79.9*  PLT 249    Basic Metabolic Panel:  Lab Results  Component Value Date   NA 141 07/19/2022   K 3.8 07/19/2022   CO2 23 07/19/2022   GLUCOSE 99 07/19/2022   BUN 7 07/19/2022   CREATININE 0.67 07/19/2022   CALCIUM 8.4 (L) 07/19/2022   GFRNONAA >60 07/19/2022   GFRAA >60 12/25/2017   Lipid Panel:  Lab Results  Component Value Date   LDLCALC 49 02/20/2014   HgbA1c: No results found for: "HGBA1C" Urine Drug Screen:     Component Value Date/Time   LABOPIA NONE DETECTED 07/19/2022 1455   COCAINSCRNUR NONE DETECTED 07/19/2022 1455    LABBENZ NONE DETECTED 07/19/2022 1455   AMPHETMU NONE DETECTED 07/19/2022 1455   THCU NONE DETECTED 07/19/2022 1455   LABBARB NONE DETECTED 07/19/2022 1455    Alcohol Level     Component Value Date/Time   ETH 489 (HH) 07/19/2022 1220    CT Head without contrast(Personally reviewed): CTH with small left-sided, recent, subdural hemorrhage, with mild mass effect, shifting the midline to the right by 3-4 mm. No parenchymal or subarachnoid hemorrhage.  rEEG:  pending  Impression   Yoshimi L Moan is a 31 y.o. male with PMH significant for etoh withdrawal seizures on outpatient librium taper who presents with a seizure and found to have a small L SDH with mild mass effect. His Etoh levels were elevated to 489 which makes withdrawal seizure unlikely. Suspect that his seizure was likely due to the noted SDH. He is neurologically intact at this time with no focal deficit.  I would favor starting him on Keppra and continuing Keppra 500mg  BID for atleast a few months. Can consider tapering it off outpatient after SDH has resorbed in a few months.  Recommendations  - routine EEG - continue CIWA protocol. - Keppra 500mg  BID - Can try tapering down keppra after a few months. - Follow up with neurology outpatient. - Neurology will be available on as needed basis. Not much else for to add. Management of SDH per Neurosurgery. ______________________________________________________________________   Thank you for the opportunity to take part in the care of  this patient. If you have any further questions, please contact the neurology consultation attending.  Signed,  [place] Pager Number [phone] _ _ _   _ __   _ __ _ _  __ __   _ __   __ _

## 2022-07-19 NOTE — ED Notes (Signed)
Pt's friend came out of room and asked if we were going to check on him. Upon entering the room pt slide down in the bed and his legs were hanging off the end. Pt had taken IV out and attempted to remove himself for the monitor. Upon asking the visitor what happened the visitor stated that he was in the bed with his IV still in and resting when he arrived. Visitor states that the pt started to get out of bed and pulled out his IV and the visitor did not stop him. RN and NT went into room to get pt situated back in bed and back on the monitor. Visitor denies any seizure activity during this incident. Pts friend also reports he was at the [facility] with pt this morning and states that he does not believe pt had a seizure this morning. He reports pt fell to the ground at the [facility]

## 2022-07-19 NOTE — ED Triage Notes (Addendum)
Pt BIB EMS from [facility]. Per bystanders pt had seizure-like activity for an unknown amount of time. Hx of seizures and alcohol abuse. Pt reports taking seizure medication 2 days ago and taking Librium about 3 days ago. Pt reports drinking heavily last night. A&O x4 with a little slowness to respond.   BP 130/82 HR 86 94% RA CBG 119

## 2022-07-19 NOTE — Consult Note (Signed)
Reason for Consult: Subdural hematoma Referring Physician: EDP  Jose Hernandez is an 31 y.o. male.   HPI:  30 year old male with a history of alcoholism and seizure disorder resented today with mental status changes.  Is found to have a high alcohol level.  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  He denies headaches.  He states "I am doing okay."  Denies numbness or weakness.  He has been admitted to the medical service.  Past Medical History:  Diagnosis Date   Abscess    Anemia    History of epistaxis    years ago    Past Surgical History:  Procedure Laterality Date   INCISION AND DRAINAGE PERIRECTAL ABSCESS N/A 03/07/2016   Procedure: IRRIGATION AND DEBRIDEMENT PERIRECTAL ABSCESS;  Surgeon: Manus Rudd, MD;  Location: [facility] OR;  Service: General;  Laterality: N/A;    No Known Allergies  Social History   Tobacco Use   Smoking status: Some Days    Types: Cigarettes   Smokeless tobacco: Never  Substance Use Topics   Alcohol use: Yes    Alcohol/week: 3.0 standard drinks of alcohol    Types: 3 Cans of beer per week    History reviewed. No pertinent family history.   Review of Systems  Positive ROS: Negative  All other systems have been reviewed and were otherwise negative with the exception of those mentioned in the HPI and as above.  Objective: Vital signs in last 24 hours: Temp:  [98.4 F (36.9 C)-98.5 F (36.9 C)] 98.4 F (36.9 C) (08/30 1542) Pulse Rate:  [75-95] 77 (08/30 1630) Resp:  [14-29] 24 (08/30 1630) BP: (106-133)/(57-97) 108/60 (08/30 1630) SpO2:  [93 %-98 %] 96 % (08/30 1630)  General Appearance: Alert, cooperative, no distress, appears stated age Head: Normocephalic, without obvious abnormality, atraumatic Eyes: PERRL, conjunctiva/corneas clear, EOM's intact   Throat: benign Neck: Supple, symmetrical, trachea midline Lungs:  respirations unlabored Heart: Regular rate and rhythm Abdomen:  Soft Extremities: Extremities normal, atraumatic, no cyanosis or edema Pulses: 2+ and symmetric all extremities Skin: Skin color, texture, turgor normal, no rashes or lesions  NEUROLOGIC:   Mental status: A&O x4, no aphasia, good attention span, Memory and fund of knowledge appear to be okay Motor Exam - grossly normal, normal tone and bulk, no pronator drift Sensory Exam - grossly normal Reflexes: symmetric, no pathologic reflexes, No Hoffman's, No clonus Coordination - grossly normal Gait -not tested Balance -not tested Cranial Nerves: I: smell Not tested  II: visual acuity  OS: na    OD: na  II: visual fields Full to confrontation  II: pupils Equal, round, reactive to light  III,VII: ptosis None  III,IV,VI: extraocular muscles  Full ROM  V: mastication Normal  V: facial light touch sensation  Normal  V,VII: corneal reflex  Present  VII: facial muscle function - upper  Normal  VII: facial muscle function - lower Normal  VIII: hearing Not tested  IX: soft palate elevation  Normal  IX,X: gag reflex Present  XI: trapezius strength  5/5  XI: sternocleidomastoid strength 5/5  XI: neck flexion strength  5/5  XII: tongue strength  Normal    Data Review Lab Results  Component Value Date   WBC 5.2 07/19/2022   HGB 11.6 (L) 07/19/2022   HCT 34.6 (L) 07/19/2022   MCV 79.9 (L) 07/19/2022   PLT 249 07/19/2022   Lab Results  Component Value Date   NA  141 07/19/2022   K 3.8 07/19/2022   CL 108 07/19/2022   CO2 23 07/19/2022   BUN 7 07/19/2022   CREATININE 0.67 07/19/2022   GLUCOSE 99 07/19/2022   No results found for: "INR", "PROTIME"  Radiology: CT Head Wo Contrast  Result Date: 07/19/2022 CLINICAL DATA:  Pt BIB EMS from [facility]. Per bystanders pt had seizure-like activity for an unknown amount of time. Hx of seizures and alcohol abuse. Pt reports taking seizure medication 2 days ago and taking Librium about 3 days ago. Pt reports drinking heavily last night. A&O x4 with a  little slowness to respond. EXAM: CT HEAD WITHOUT CONTRAST TECHNIQUE: Contiguous axial images were obtained from the base of the skull through the vertex without intravenous contrast. RADIATION DOSE REDUCTION: This exam was performed according to the departmental dose-optimization program which includes automated exposure control, adjustment of the mA and/or kV according to patient size and/or use of iterative reconstruction technique. COMPARISON:  04/23/2022. FINDINGS: Brain: Small left-sided subdural hemorrhage, thickness 6-7 mm, extending over the left frontal and parietal lobes, average Hounsfield units of 39, new since the prior exam. Ventricles are normal in size and configuration. There are no parenchymal or extra-axial masses. No evidence of an ischemic infarct. No other evidence of intracranial hemorrhage. Left subdural hemorrhage causes mild mass effect with midline shift to the right of 3-4 mm. Vascular: No hyperdense vessel or unexpected calcification. Skull: Normal. Negative for fracture or focal lesion. Sinuses/Orbits: Globes and orbits are unremarkable. The visualized sinuses are clear. Other: No evidence of a scalp contusion. IMPRESSION: 1. Small left-sided, recent, subdural hemorrhage, with mild mass effect, shifting the midline to the right by 3-4 mm. No parenchymal or subarachnoid hemorrhage. 2. No other acute or recent intracranial abnormality. Electronically Signed   By: Amie Portland M.D.   On: 07/19/2022 14:18      Assessment/Plan: Estimated body mass index is 25.1 kg/m as calculated from the following:   Height as of 06/01/22: 5\' 9"  (1.753 m).   Weight as of 06/01/22: 109.37 kg.   31 year old gentleman with a history of alcoholism and seizure disorder on Librium and Keppra but it appears he stopped taking those a few days ago, and CT scan of the head today shows a left chronic subdural hematoma mild mass effect and shift.  He appears to be asymptomatic from this as best we can tell.   No headaches and no weakness and no aphasia.  While I do not think he is a good candidate for bur holes or craniotomy for evacuation of the lesion at this time, he may be a good candidate for MMA embolization which is a reasonably benign procedure with good outcomes and national studies.  We have had excellent success with this here in a limited cohort.  I have reached out to Dr. 34, my endovascular neurosurgeon, he will look at the images.  I also favor this approach because I think this patient will not be pliant with follow-up.   Conchita Paris 07/19/2022 5:23 PM

## 2022-07-20 ENCOUNTER — Inpatient Hospital Stay (HOSPITAL_COMMUNITY): Payer: 59

## 2022-07-20 ENCOUNTER — Other Ambulatory Visit (HOSPITAL_COMMUNITY): Payer: Self-pay

## 2022-07-20 DIAGNOSIS — R569 Unspecified convulsions: Secondary | ICD-10-CM

## 2022-07-20 DIAGNOSIS — S065XAA Traumatic subdural hemorrhage with loss of consciousness status unknown, initial encounter: Secondary | ICD-10-CM

## 2022-07-20 LAB — CBC
HCT: 36.4 % — ABNORMAL LOW (ref 39.0–52.0)
Hemoglobin: 12 g/dL — ABNORMAL LOW (ref 13.0–17.0)
MCH: 26.5 pg (ref 26.0–34.0)
MCHC: 33 g/dL (ref 30.0–36.0)
MCV: 80.4 fL (ref 80.0–100.0)
Platelets: 217 10*3/uL (ref 150–400)
RBC: 4.53 MIL/uL (ref 4.22–5.81)
RDW: 15.3 % (ref 11.5–15.5)
WBC: 5.6 10*3/uL (ref 4.0–10.5)
nRBC: 0 % (ref 0.0–0.2)

## 2022-07-20 LAB — COMPREHENSIVE METABOLIC PANEL
ALT: 24 U/L (ref 0–44)
AST: 47 U/L — ABNORMAL HIGH (ref 15–41)
Albumin: 3.6 g/dL (ref 3.5–5.0)
Alkaline Phosphatase: 41 U/L (ref 38–126)
Anion gap: 13 (ref 5–15)
BUN: 7 mg/dL (ref 6–20)
CO2: 23 mmol/L (ref 22–32)
Calcium: 8.7 mg/dL — ABNORMAL LOW (ref 8.9–10.3)
Chloride: 108 mmol/L (ref 98–111)
Creatinine, Ser: 0.75 mg/dL (ref 0.61–1.24)
GFR, Estimated: >60 mL/min (ref >60)
Glucose, Bld: 61 mg/dL — ABNORMAL LOW (ref 70–99)
Potassium: 4.1 mmol/L (ref 3.5–5.1)
Sodium: 144 mmol/L (ref 135–145)
Total Bilirubin: 0.8 mg/dL (ref 0.3–1.2)
Total Protein: 7.1 g/dL (ref 6.5–8.1)

## 2022-07-20 NOTE — Procedures (Signed)
Patient Name: Jose Hernandez  MRN: [id number]  Epilepsy Attending: Charlsie Quest  Referring Physician/Provider: Teddy Spike, DO  Date: 07/20/2022 Duration: 26.48 mins  Patient history: 31 year old male with history of alcoholism who presented with altered mental status and seizure-like episode in the setting of underlying chronic left subdural hematoma.  EEG to evaluate for seizure.  Level of alertness: Awake, asleep  AEDs during EEG study: LEV  Technical aspects: This EEG study was done with scalp electrodes positioned according to the 10-20 International system of electrode placement. Electrical activity was reviewed with band pass filter of 1-70Hz , sensitivity of 7 uV/mm, display speed of 83mm/sec with a 60Hz  notched filter applied as appropriate. EEG data were recorded continuously and digitally stored.  Video monitoring was available and reviewed as appropriate.  Description: The posterior dominant rhythm consists of 8-9 Hz activity of moderate voltage (25-35 uV) seen predominantly in posterior head regions, symmetric and reactive to eye opening and eye closing. Sleep was characterized by vertex waves, sleep spindles (12 to 14 Hz), maximal frontocentral region. Hyperventilation and photic stimulation were not performed.     IMPRESSION: This study is within normal limits. No seizures or epileptiform discharges were seen throughout the recording.  A normal interictal EEG does not exclude nor support the diagnosis of epilepsy.   Dariusz Brase 

## 2022-07-20 NOTE — Progress Notes (Signed)
Subjective: Patient reports no acute events overnight. Biggest complaint is that he is hungry.   Objective: Vital signs in last 24 hours: Temp:  [97.7 F (36.5 C)-98.5 F (36.9 C)] 98 F (36.7 C) (08/31 0600) Pulse Rate:  [67-95] 92 (08/31 0600) Resp:  [11-29] 18 (08/31 0600) BP: (106-133)/(57-97) 123/85 (08/31 0600) SpO2:  [93 %-99 %] 99 % (08/31 0600)  Intake/Output from previous day: 08/30 0701 - 08/31 0700 In: 100 [IV Piggyback:100] Out: 800 [Urine:800] Intake/Output this shift: No intake/output data recorded.  Neurologic: Grossly normal  Lab Results: Lab Results  Component Value Date   WBC 5.6 07/20/2022   HGB 12.0 (L) 07/20/2022   HCT 36.4 (L) 07/20/2022   MCV 80.4 07/20/2022   PLT 217 07/20/2022   No results found for: "INR", "PROTIME" BMET Lab Results  Component Value Date   NA 144 07/20/2022   K 4.1 07/20/2022   CL 108 07/20/2022   CO2 23 07/20/2022   GLUCOSE 61 (L) 07/20/2022   BUN 7 07/20/2022   CREATININE 0.75 07/20/2022   CALCIUM 8.7 (L) 07/20/2022    Studies/Results: CT Head Wo Contrast  Result Date: 07/19/2022 CLINICAL DATA:  Pt BIB EMS from [facility]. Per bystanders pt had seizure-like activity for an unknown amount of time. Hx of seizures and alcohol abuse. Pt reports taking seizure medication 2 days ago and taking Librium about 3 days ago. Pt reports drinking heavily last night. A&O x4 with a little slowness to respond. EXAM: CT HEAD WITHOUT CONTRAST TECHNIQUE: Contiguous axial images were obtained from the base of the skull through the vertex without intravenous contrast. RADIATION DOSE REDUCTION: This exam was performed according to the departmental dose-optimization program which includes automated exposure control, adjustment of the mA and/or kV according to patient size and/or use of iterative reconstruction technique. COMPARISON:  04/23/2022. FINDINGS: Brain: Small left-sided subdural hemorrhage, thickness 6-7 mm, extending over the left frontal and  parietal lobes, average Hounsfield units of 39, new since the prior exam. Ventricles are normal in size and configuration. There are no parenchymal or extra-axial masses. No evidence of an ischemic infarct. No other evidence of intracranial hemorrhage. Left subdural hemorrhage causes mild mass effect with midline shift to the right of 3-4 mm. Vascular: No hyperdense vessel or unexpected calcification. Skull: Normal. Negative for fracture or focal lesion. Sinuses/Orbits: Globes and orbits are unremarkable. The visualized sinuses are clear. Other: No evidence of a scalp contusion. IMPRESSION: 1. Small left-sided, recent, subdural hemorrhage, with mild mass effect, shifting the midline to the right by 3-4 mm. No parenchymal or subarachnoid hemorrhage. 2. No other acute or recent intracranial abnormality. Electronically Signed   By: Amie Portland M.D.   On: 07/19/2022 14:18    Assessment/Plan: S/p left chronic SDH. Ok to have regular diet now. Will make him NPO once plan of care is established with Dr. Conchita Paris.    LOS: 1 day    Tiana Loft [facility] 07/20/2022, 6:36 AM

## 2022-07-20 NOTE — Progress Notes (Signed)
Jose Hernandez  SWH:[id number] DOB: February 23, 1991 DOA: 07/19/2022 PCP: Pcp, No    Brief Narrative:  31 year old with a history of alcohol abuse and seizures who was brought to the [facility] ER via EMS [facility] shelter.  It was reported that he had not been taking his seizure medications for many days.  CT head in the ER revealed a subdural hemorrhage.  The patient was transferred to [facility] to allow for Neurosurgical evaluation and consideration for middle meningeal artery embolization.  Consultants:  Neurology Neurosurgery  Goals of Care:  Code Status: Full Code   DVT prophylaxis: SCDs  Interim Hx: The patient is resting comfortably in the bed at time of visit.  There is no evidence of respiratory distress or uncontrolled pain.  Assessment & Plan:  Recurrent seizure with seizure disorder Appears to be due to noncompliance with medical therapy - Neurology directing medical therapy -patient was loaded with Keppra with ongoing maintenance dosing  Altered mental status -postictal state Monitor and supportive environment  Left chronic subdural hematoma Mild mass effect and shift on CT head - Neurosurgery has evaluated and plans for MMA embolization 07/21/22  Alcohol abuse with acute alcohol intoxication at presentation Continue CIWA  Microcytic anemia Assess anemia panel   Hypocalcemia - ruled out Ca2+ actually corrects to 9.02 given lowish albumin, therefore he is not truly hypocalcemic    Family Communication: No family present at time of exam Disposition: Depends upon recovery after neurosurgery   Objective: Blood pressure 127/78, pulse 89, temperature 98 F (36.7 C), temperature source Oral, resp. rate 16, SpO2 97 %.  Intake/Output Summary (Last 24 hours) at 07/20/2022 1016 Last data filed at 07/19/2022 1804 Gross per 24 hour  Intake 100 ml  Output 800 ml  Net -700 ml   There were no vitals filed for this visit.  Examination: General: No acute  respiratory distress Lungs: Clear to auscultation bilaterally without wheezes or crackles Cardiovascular: Regular rate and rhythm without murmur gallop or rub normal S1 and S2 Abdomen: Nontender, nondistended, soft, bowel sounds positive, no rebound, no ascites, no appreciable mass Extremities: No significant cyanosis, clubbing, or edema bilateral lower extremities  CBC: Recent Labs  Lab 07/19/22 1220 07/20/22 0415  WBC 5.2 5.6  NEUTROABS 3.8  --   HGB 11.6* 12.0*  HCT 34.6* 36.4*  MCV 79.9* 80.4  PLT 249 217   Basic Metabolic Panel: Recent Labs  Lab 07/19/22 1220 07/19/22 2218 07/20/22 0415  NA 141  --  144  K 3.8  --  4.1  CL 108  --  108  CO2 23  --  23  GLUCOSE 99  --  61*  BUN 7  --  7  CREATININE 0.67  --  0.75  CALCIUM 8.4*  --  8.7*  MG  --  2.0  --    GFR: CrCl cannot be calculated (Unknown ideal weight.).  Liver Function Tests: Recent Labs  Lab 07/19/22 1220 07/20/22 0415  AST 45* 47*  ALT 24 24  ALKPHOS 41 41  BILITOT 1.0 0.8  PROT 7.9 7.1  ALBUMIN 4.1 3.6    Scheduled Meds:  folic acid  1 mg Oral Daily   levETIRAcetam  500 mg Oral BID   multivitamin with minerals  1 tablet Oral Daily   mouth rinse  15 mL Mouth Rinse Q2H   thiamine  100 mg Oral Daily   Or   thiamine  100 mg Intravenous Daily   Continuous Infusions:  sodium chloride 75 mL/hr (07/19/22 2132)   lactated ringers 125 mL/hr at 07/19/22 1216   levETIRAcetam 500 mg (07/20/22 0019)     LOS: 1 day   Lonia Blood, MD [facility] Office  [phone] Pager - Text Page per Amion  If 7PM-7AM, please contact night-coverage per Amion 07/20/2022, 10:16 AM

## 2022-07-20 NOTE — Progress Notes (Signed)
EEG complete - results pending 

## 2022-07-20 NOTE — Progress Notes (Signed)
  NEUROSURGERY PROGRESS NOTE   No issues overnight. History reviewed with patient and Dr. Yetta Barre. Briefly, pt presented with confusion/AMS and history of SZ disorder as well as EtOH abuse. W/u included CT head demonstrated small subacute left convexity SDH. I was asked to see the patient for possible MMA embolization.  Of note, patient does not have any history of HTN, DM or previous heart disease/stroke. He is not on anticoagulation/antiplatelet medication.  EXAM:  BP 134/78 (BP Location: Right Arm)   Pulse 82   Temp 98 F (36.7 C) (Oral)   Resp 16   SpO2 100%   Awake, alert, oriented  Speech fluent, appropriate  CN grossly intact  5/5 BUE/BLE   IMAGING: CTH reviewed demonstrating approximately 39mm left frontoparietal convexity subacuteSDH with mild local mass effect, minimal MLS. No HCP  IMPRESSION:  31 y.o. male with asymptomatic subacute left convexity SDH with mild local mass effect. He would be a candidate for MMA embolization to maximize chances of resolution especially with concern for reliable follow-up.  PLAN: - Will plan on left MMA embolization tomorrow pm - NPO after MN  I have reviewed the situation with the patient. We discussed option for MMA embolization and the alternative of expectant observation. We reviewed the details of the operation and the expected postop course and recovery. We discussed the risks of the procedure to include stroke, arterial dissection, contrast reaction, nephropathy, and groin hematoma. All his questions were answered and he indicated desire to proceed with the procedure.   Lisbeth Renshaw, MD [facility]    Lisbeth Renshaw, MD [facility]

## 2022-07-21 ENCOUNTER — Encounter (HOSPITAL_COMMUNITY)
Admission: EM | Disposition: A | Discharge status: Home / Self Care | Payer: Self-pay | Source: Home / Self Care | Attending: Internal Medicine

## 2022-07-21 ENCOUNTER — Encounter (HOSPITAL_COMMUNITY): Payer: Self-pay | Admitting: Internal Medicine

## 2022-07-21 ENCOUNTER — Other Ambulatory Visit (HOSPITAL_COMMUNITY): Payer: Self-pay | Admitting: Interventional Radiology

## 2022-07-21 ENCOUNTER — Inpatient Hospital Stay (HOSPITAL_COMMUNITY): Payer: 59 | Admitting: Anesthesiology

## 2022-07-21 ENCOUNTER — Inpatient Hospital Stay (HOSPITAL_COMMUNITY): Payer: 59

## 2022-07-21 DIAGNOSIS — I6203 Nontraumatic chronic subdural hemorrhage: Secondary | ICD-10-CM

## 2022-07-21 DIAGNOSIS — D649 Anemia, unspecified: Secondary | ICD-10-CM

## 2022-07-21 DIAGNOSIS — F17210 Nicotine dependence, cigarettes, uncomplicated: Secondary | ICD-10-CM

## 2022-07-21 DIAGNOSIS — S065XAA Traumatic subdural hemorrhage with loss of consciousness status unknown, initial encounter: Secondary | ICD-10-CM

## 2022-07-21 HISTORY — PX: IR NEURO EACH ADD'L AFTER BASIC UNI LEFT (MS): IMG5373

## 2022-07-21 HISTORY — PX: IR ANGIO EXTERNAL CAROTID SEL EXT CAROTID UNI L MOD SED: IMG5370

## 2022-07-21 HISTORY — PX: IR ANGIOGRAM FOLLOW UP STUDY: IMG697

## 2022-07-21 HISTORY — PX: IR ANGIO INTRA EXTRACRAN SEL INTERNAL CAROTID UNI L MOD SED: IMG5361

## 2022-07-21 HISTORY — PX: RADIOLOGY WITH ANESTHESIA: SHX6223

## 2022-07-21 HISTORY — PX: IR TRANSCATH/EMBOLIZ: IMG695

## 2022-07-21 LAB — TYPE AND SCREEN
ABO/RH(D): A POS
Antibody Screen: NEGATIVE

## 2022-07-21 LAB — RETICULOCYTES
Immature Retic Fract: 6.2 % (ref 2.3–15.9)
RBC.: 4.51 MIL/uL (ref 4.22–5.81)
Retic Count, Absolute: 47.4 10*3/uL (ref 19.0–186.0)
Retic Ct Pct: 1.1 % (ref 0.4–3.1)

## 2022-07-21 LAB — MRSA NEXT GEN BY PCR, NASAL: MRSA by PCR Next Gen: NOT DETECTED

## 2022-07-21 LAB — FERRITIN: Ferritin: 105 ng/mL (ref 24–336)

## 2022-07-21 LAB — COMPREHENSIVE METABOLIC PANEL
ALT: 25 U/L (ref 0–44)
AST: 43 U/L — ABNORMAL HIGH (ref 15–41)
Albumin: 3.5 g/dL (ref 3.5–5.0)
Alkaline Phosphatase: 42 U/L (ref 38–126)
Anion gap: 11 (ref 5–15)
BUN: <5 mg/dL — ABNORMAL LOW (ref 6–20)
CO2: 27 mmol/L (ref 22–32)
Calcium: 9.4 mg/dL (ref 8.9–10.3)
Chloride: 100 mmol/L (ref 98–111)
Creatinine, Ser: 0.78 mg/dL (ref 0.61–1.24)
GFR, Estimated: >60 mL/min (ref >60)
Glucose, Bld: 97 mg/dL (ref 70–99)
Potassium: 3.8 mmol/L (ref 3.5–5.1)
Sodium: 138 mmol/L (ref 135–145)
Total Bilirubin: 1.5 mg/dL — ABNORMAL HIGH (ref 0.3–1.2)
Total Protein: 6.9 g/dL (ref 6.5–8.1)

## 2022-07-21 LAB — CBC
HCT: 35.3 % — ABNORMAL LOW (ref 39.0–52.0)
Hemoglobin: 12 g/dL — ABNORMAL LOW (ref 13.0–17.0)
MCH: 26.4 pg (ref 26.0–34.0)
MCHC: 34 g/dL (ref 30.0–36.0)
MCV: 77.8 fL — ABNORMAL LOW (ref 80.0–100.0)
Platelets: 234 10*3/uL (ref 150–400)
RBC: 4.54 MIL/uL (ref 4.22–5.81)
RDW: 14.6 % (ref 11.5–15.5)
WBC: 6.1 10*3/uL (ref 4.0–10.5)
nRBC: 0 % (ref 0.0–0.2)

## 2022-07-21 LAB — VITAMIN B12: Vitamin B-12: 73 pg/mL — ABNORMAL LOW (ref 180–914)

## 2022-07-21 LAB — IRON AND TIBC
Iron: 300 ug/dL — ABNORMAL HIGH (ref 45–182)
Saturation Ratios: 90 % — ABNORMAL HIGH (ref 17.9–39.5)
TIBC: 332 ug/dL (ref 250–450)
UIBC: 32 ug/dL

## 2022-07-21 LAB — FOLATE: Folate: 6.8 ng/mL (ref >5.9)

## 2022-07-21 LAB — LEVETIRACETAM LEVEL: Levetiracetam Lvl: 28.9 ug/mL (ref 10.0–40.0)

## 2022-07-21 SURGERY — IR WITH ANESTHESIA
Anesthesia: General

## 2022-07-21 MED ORDER — DEXAMETHASONE SODIUM PHOSPHATE 10 MG/ML IJ SOLN
INTRAMUSCULAR | Status: DC | PRN
  Administered 2022-07-21: 10 mg via INTRAVENOUS

## 2022-07-21 MED ORDER — ROCURONIUM BROMIDE 10 MG/ML (PF) SYRINGE
PREFILLED_SYRINGE | INTRAVENOUS | Status: DC | PRN
  Administered 2022-07-21: 60 mg via INTRAVENOUS
  Administered 2022-07-21: 40 mg via INTRAVENOUS

## 2022-07-21 MED ORDER — ORAL CARE MOUTH RINSE
15.0000 mL | Freq: Once | OROMUCOSAL | Status: AC
Start: 2022-07-21 — End: 2022-07-21

## 2022-07-21 MED ORDER — LIDOCAINE 2% (20 MG/ML) 5 ML SYRINGE
INTRAMUSCULAR | Status: DC | PRN
  Administered 2022-07-21: 60 mg via INTRAVENOUS

## 2022-07-21 MED ORDER — MIDAZOLAM HCL 2 MG/2ML IJ SOLN
INTRAMUSCULAR | Status: AC
  Filled 2022-07-21: qty 2

## 2022-07-21 MED ORDER — IOHEXOL 300 MG/ML  SOLN: 100.0000 mL | Freq: Once | INTRAMUSCULAR | Status: DC | PRN

## 2022-07-21 MED ORDER — CHLORHEXIDINE GLUCONATE 0.12 % MT SOLN
OROMUCOSAL | Status: AC
  Administered 2022-07-21: 15 mL via OROMUCOSAL
  Filled 2022-07-21: qty 15

## 2022-07-21 MED ORDER — CLEVIDIPINE BUTYRATE 0.5 MG/ML IV EMUL
INTRAVENOUS | Status: AC
  Filled 2022-07-21: qty 50

## 2022-07-21 MED ORDER — ORAL CARE MOUTH RINSE
15.0000 mL | OROMUCOSAL | Status: DC | PRN
Start: 2022-07-21 — End: 2022-07-22

## 2022-07-21 MED ORDER — SUGAMMADEX SODIUM 200 MG/2ML IV SOLN
INTRAVENOUS | Status: DC | PRN
  Administered 2022-07-21 (×2): 200 mg via INTRAVENOUS

## 2022-07-21 MED ORDER — SODIUM CHLORIDE 0.9 % IV SOLN: INTRAVENOUS | Status: DC

## 2022-07-21 MED ORDER — CLEVIDIPINE BUTYRATE 0.5 MG/ML IV EMUL
0.0000 mg/h | INTRAVENOUS | Status: DC
  Administered 2022-07-22: 10 mg/h via INTRAVENOUS
  Administered 2022-07-22: 4 mg/h via INTRAVENOUS
  Administered 2022-07-22: 8 mg/h via INTRAVENOUS
  Filled 2022-07-21 (×3): qty 50

## 2022-07-21 MED ORDER — CYANOCOBALAMIN 1000 MCG/ML IJ SOLN
1000.0000 ug | Freq: Every day | INTRAMUSCULAR | Status: DC
Start: 2022-07-21 — End: 2022-07-22
  Administered 2022-07-21 – 2022-07-22 (×2): 1000 ug via SUBCUTANEOUS
  Filled 2022-07-21 (×2): qty 1

## 2022-07-21 MED ORDER — CHLORHEXIDINE GLUCONATE 0.12 % MT SOLN
15.0000 mL | Freq: Once | OROMUCOSAL | Status: AC
Start: 2022-07-21 — End: 2022-07-21

## 2022-07-21 MED ORDER — CLEVIDIPINE BUTYRATE 0.5 MG/ML IV EMUL
INTRAVENOUS | Status: DC | PRN
  Administered 2022-07-21: 1 mg/h via INTRAVENOUS

## 2022-07-21 MED ORDER — ONDANSETRON HCL 4 MG/2ML IJ SOLN
INTRAMUSCULAR | Status: DC | PRN
  Administered 2022-07-21: 4 mg via INTRAVENOUS

## 2022-07-21 MED ORDER — HEPARIN SODIUM (PORCINE) 1000 UNIT/ML IJ SOLN
INTRAMUSCULAR | Status: DC | PRN
  Administered 2022-07-21: 3000 [IU] via INTRAVENOUS

## 2022-07-21 MED ORDER — FENTANYL CITRATE (PF) 250 MCG/5ML IJ SOLN
INTRAMUSCULAR | Status: DC | PRN
  Administered 2022-07-21: 100 ug via INTRAVENOUS
  Administered 2022-07-21: 50 ug via INTRAVENOUS

## 2022-07-21 MED ORDER — CHLORHEXIDINE GLUCONATE CLOTH 2 % EX PADS
6.0000 | MEDICATED_PAD | Freq: Every day | CUTANEOUS | Status: DC
  Administered 2022-07-22: 6 via TOPICAL

## 2022-07-21 MED ORDER — PHENYLEPHRINE 80 MCG/ML (10ML) SYRINGE FOR IV PUSH (FOR BLOOD PRESSURE SUPPORT)
PREFILLED_SYRINGE | INTRAVENOUS | Status: DC | PRN
  Administered 2022-07-21: 160 ug via INTRAVENOUS
  Administered 2022-07-21 (×2): 80 ug via INTRAVENOUS

## 2022-07-21 MED ORDER — BUTALBITAL-APAP-CAFFEINE 50-325-40 MG PO TABS
1.0000 | ORAL_TABLET | Freq: Four times a day (QID) | ORAL | Status: DC | PRN
  Administered 2022-07-21 – 2022-07-22 (×2): 2 via ORAL
  Filled 2022-07-21 (×2): qty 2

## 2022-07-21 MED ORDER — FENTANYL CITRATE (PF) 250 MCG/5ML IJ SOLN
INTRAMUSCULAR | Status: AC
  Filled 2022-07-21: qty 5

## 2022-07-21 MED ORDER — FENTANYL CITRATE (PF) 100 MCG/2ML IJ SOLN
INTRAMUSCULAR | Status: AC
  Filled 2022-07-21: qty 2

## 2022-07-21 MED ORDER — HYDRALAZINE HCL 20 MG/ML IJ SOLN
5.0000 mg | Freq: Once | INTRAMUSCULAR | Status: AC
  Administered 2022-07-21: 5 mg via INTRAVENOUS

## 2022-07-21 MED ORDER — PHENYLEPHRINE HCL-NACL 20-0.9 MG/250ML-% IV SOLN
INTRAVENOUS | Status: DC | PRN
  Administered 2022-07-21: 20 ug/min via INTRAVENOUS

## 2022-07-21 MED ORDER — CLEVIDIPINE BUTYRATE 0.5 MG/ML IV EMUL
0.0000 mg/h | INTRAVENOUS | Status: DC
  Administered 2022-07-21: 10 mg/h via INTRAVENOUS

## 2022-07-21 MED ORDER — HYDRALAZINE HCL 20 MG/ML IJ SOLN
INTRAMUSCULAR | Status: AC
  Filled 2022-07-21: qty 1

## 2022-07-21 MED ORDER — SODIUM CHLORIDE 0.9 % IV SOLN: INTRAVENOUS | Status: DC | PRN

## 2022-07-21 MED ORDER — FENTANYL CITRATE (PF) 100 MCG/2ML IJ SOLN
25.0000 ug | INTRAMUSCULAR | Status: DC | PRN
  Administered 2022-07-21: 25 ug via INTRAVENOUS

## 2022-07-21 MED ORDER — PROPOFOL 10 MG/ML IV BOLUS
INTRAVENOUS | Status: DC | PRN
  Administered 2022-07-21: 10 mg via INTRAVENOUS
  Administered 2022-07-21: 30 mg via INTRAVENOUS
  Administered 2022-07-21: 50 mg via INTRAVENOUS
  Administered 2022-07-21: 150 mg via INTRAVENOUS

## 2022-07-21 MED ORDER — MIDAZOLAM HCL 2 MG/2ML IJ SOLN
INTRAMUSCULAR | Status: DC | PRN
  Administered 2022-07-21: 2 mg via INTRAVENOUS

## 2022-07-21 NOTE — Progress Notes (Signed)
  NEUROSURGERY PROGRESS NOTE   No issues overnight. Pt without significant complaint this am. No seizures.  EXAM:  BP (!) 146/97 (BP Location: Left Arm)   Pulse 72   Temp 98.6 F (37 C) (Oral)   Resp 17   SpO2 100%   Awake, alert, oriented  Speech fluent, appropriate  CN grossly intact  5/5 BUE/BLE   IMPRESSION:  31 y.o. male with relatively small left chronic convexity SDH, essentially asymptomatic  PLAN: - Will proceed with endovascular left MMA embolization this pm. - Assuming no issues with procedure, pt would be stable for d/c from a neurosurgical standpoint tomorrow. - Cont Keppra - NPO   Lisbeth Renshaw, MD [facility]

## 2022-07-21 NOTE — Sedation Documentation (Signed)
Patient transported to PACU with CRNA Toni Amend. PACU staff at the bedside to receive patient. Groin site assessed. Clean, dry and intact. NO drainage from dressing. Soft to palpation, no hematoma noted. +2 palpable distal pulses intact.

## 2022-07-21 NOTE — Anesthesia Procedure Notes (Signed)
Procedure Name: Intubation Date/Time: 07/21/2022 3:58 PM  Performed by: Dorthea Cove, CRNAPre-anesthesia Checklist: Patient identified, Emergency Drugs available, Suction available and Patient being monitored Patient Re-evaluated:Patient Re-evaluated prior to induction Oxygen Delivery Method: Circle system utilized Preoxygenation: Pre-oxygenation with 100% oxygen Induction Type: IV induction Ventilation: Mask ventilation without difficulty Laryngoscope Size: Mac and 4 Grade View: Grade I Tube type: Oral Tube size: 7.5 mm Number of attempts: 1 Airway Equipment and Method: Stylet and Oral airway Placement Confirmation: ETT inserted through vocal cords under direct vision, positive ETCO2 and breath sounds checked- equal and bilateral Secured at: 23 cm Tube secured with: Tape Dental Injury: Teeth and Oropharynx as per pre-operative assessment

## 2022-07-21 NOTE — Transfer of Care (Signed)
Immediate Anesthesia Transfer of Care Note  Patient: Jose Hernandez  Procedure(s) Performed: [facility] embolization  Patient Location: PACU  Anesthesia Type:General  Level of Consciousness: awake, alert  and oriented  Airway & Oxygen Therapy: Patient Spontanous Breathing and Patient connected to nasal cannula oxygen  Post-op Assessment: Report given to RN and Post -op Vital signs reviewed and stable  Post vital signs: Reviewed and stable  Last Vitals:  Vitals Value Taken Time  BP 141/86 07/21/22 1747  Temp 36.9 C 07/21/22 1747  Pulse 109 07/21/22 1755  Resp 24 07/21/22 1755  SpO2 99 % 07/21/22 1755  Vitals shown include unvalidated device data.  Last Pain:  Vitals:   07/21/22 1311  TempSrc:   PainSc: 8          Complications: No notable events documented.

## 2022-07-21 NOTE — Anesthesia Preprocedure Evaluation (Addendum)
Anesthesia Evaluation  Patient identified by MRN, date of birth, ID band Patient awake    Reviewed: Allergy & Precautions, H&P , NPO status , Patient's Chart, lab work & pertinent test results  Airway Mallampati: II  TM Distance: >3 FB Neck ROM: Full    Dental no notable dental hx. (+) Teeth Intact, Dental Advisory Given   Pulmonary Current Smoker and Patient abstained from smoking.,    Pulmonary exam normal breath sounds clear to auscultation       Cardiovascular negative cardio ROS   Rhythm:Regular Rate:Normal     Neuro/Psych SAH negative neurological ROS  negative psych ROS   GI/Hepatic negative GI ROS, Neg liver ROS,   Endo/Other  negative endocrine ROS  Renal/GU negative Renal ROS  negative genitourinary   Musculoskeletal   Abdominal   Peds  Hematology  (+) Blood dyscrasia, anemia ,   Anesthesia Other Findings   Reproductive/Obstetrics negative OB ROS                            Anesthesia Physical Anesthesia Plan  ASA: 3  Anesthesia Plan: General   Post-op Pain Management: Tylenol PO (pre-op)*   Induction: Intravenous  PONV Risk Score and Plan: 2 and Ondansetron, Dexamethasone and Midazolam  Airway Management Planned: Oral ETT  Additional Equipment: Arterial line  Intra-op Plan:   Post-operative Plan: Extubation in OR and Possible Post-op intubation/ventilation  Informed Consent: I have reviewed the patients History and Physical, chart, labs and discussed the procedure including the risks, benefits and alternatives for the proposed anesthesia with the patient or authorized representative who has indicated his/her understanding and acceptance.     Dental advisory given  Plan Discussed with: CRNA  Anesthesia Plan Comments:         Anesthesia Quick Evaluation

## 2022-07-21 NOTE — Anesthesia Procedure Notes (Signed)
Arterial Line Insertion Start/End9/11/2021 1:51 PM, 07/21/2022 1:51 PM Performed by: Dorie Rank, CRNA  Preanesthetic checklist: patient identified, IV checked, site marked, risks and benefits discussed, surgical consent, monitors and equipment checked, pre-op evaluation and timeout performed Lidocaine 1% used for infiltration radial was placed Catheter size: 20 G Hand hygiene performed , maximum sterile barriers used  and Seldinger technique used Allen's test indicative of satisfactory collateral circulation Attempts: 1 Procedure performed without using ultrasound guided technique. Following insertion, dressing applied. Post procedure assessment: normal  Patient tolerated the procedure well with no immediate complications.

## 2022-07-21 NOTE — Brief Op Note (Signed)
  NEUROSURGERY BRIEF OPERATIVE  NOTE   PREOP DX: chronic left subdural hematoma  POSTOP DX: Same  PROCEDURE: Onyx embolization of left middle meningeal artery  SURGEON: Dr. Lisbeth Renshaw, MD  ANESTHESIA: GETA  EBL: Minimal  SPECIMENS: None  COMPLICATIONS: None  CONDITION: Stable to recovery  FINDINGS (Full report in [facility]): 1. Successful Onyx embolization of the left middle meningeal artery   Lisbeth Renshaw, MD [facility]

## 2022-07-21 NOTE — Progress Notes (Signed)
Jose Hernandez  ZOX:[id number] DOB: [birth year] DOA: 07/19/2022 PCP: Pcp, No    Brief Narrative:  31 year old with a history of alcohol abuse and seizures who was brought to the [facility] via EMS after witnessed recurrent seizures.  It was reported that he had not been taking his seizure medications for many days.  CT head in the ER revealed a subdural hemorrhage.  The patient was transferred to [facility] to allow for Neurosurgical evaluation and consideration for middle meningeal artery embolization.  Consultants:  Neurology Neurosurgery  Goals of Care:  Code Status: Full Code   DVT prophylaxis: SCDs  Interim Hx: No acute events overnight.  Afebrile.  Blood pressure trending upward.  Admits to feeling nervous about procedure today.  Modest ongoing headache.  No chest pain or shortness of breath.  Assessment & Plan:  Recurrent seizure with seizure disorder Appears to be due to noncompliance with medical therapy - Neurology directing medical therapy -patient was loaded with Keppra with ongoing maintenance dosing  Altered mental status -postictal state Monitor in supportive environment  Left chronic subdural hematoma Mild mass effect and shift on CT head - Neurosurgery has evaluated and plans for MMA embolization today  Alcohol abuse with acute alcohol intoxication at presentation Continue CIWA -no evidence of significant withdrawal thus far  Microcytic anemia Iron level actually elevated at 300 with TIBC of 332 - ferritin not elevated at 105 -folic acid normal  Profound B12 deficiency B12 level 73 -initiate supplementation -likely due to very poor nutritional state in setting of substance abuse  Hypocalcemia - ruled out Ca2+ actually corrects to 9.02 given lowish albumin, therefore he is not truly hypocalcemic    Family Communication: No family present at time of exam Disposition: Depends upon recovery after neurosurgery   Objective: Blood pressure (!) 154/101, pulse 62,  temperature 97.9 F (36.6 C), temperature source Oral, resp. rate 16, SpO2 100 %.  Intake/Output Summary (Last 24 hours) at 07/21/2022 0953 Last data filed at 07/21/2022 0600 Gross per 24 hour  Intake 2964.69 ml  Output 900 ml  Net 2064.69 ml    There were no vitals filed for this visit.  Examination: General: No acute respiratory distress Lungs: Clear to auscultation bilaterally without wheezing Cardiovascular: Regular rate and rhythm without murmur g Abdomen: Nontender, nondistended, soft, bowel sounds positive, no rebound Extremities: No significant edema bilateral lower extremities  CBC: Recent Labs  Lab 07/19/22 1220 07/20/22 0415 07/21/22 0549  WBC 5.2 5.6 6.1  NEUTROABS 3.8  --   --   HGB 11.6* 12.0* 12.0*  HCT 34.6* 36.4* 35.3*  MCV 79.9* 80.4 77.8*  PLT 249 217 234    Basic Metabolic Panel: Recent Labs  Lab 07/19/22 1220 07/19/22 2218 07/20/22 0415 07/21/22 0549  NA 141  --  144 138  K 3.8  --  4.1 3.8  CL 108  --  108 100  CO2 23  --  23 27  GLUCOSE 99  --  61* 97  BUN 7  --  7 <5*  CREATININE 0.67  --  0.75 0.78  CALCIUM 8.4*  --  8.7* 9.4  MG  --  2.0  --   --     GFR: CrCl cannot be calculated (Unknown ideal weight.).  Liver Function Tests: Recent Labs  Lab 07/19/22 1220 07/20/22 0415 07/21/22 0549  AST 45* 47* 43*  ALT 24 24 25   ALKPHOS 41 41 42  BILITOT 1.0 0.8 1.5*  PROT 7.9 7.1 6.9  ALBUMIN  4.1 3.6 3.5     Scheduled Meds:  folic acid  1 mg Oral Daily   levETIRAcetam  500 mg Oral BID   multivitamin with minerals  1 tablet Oral Daily   mouth rinse  15 mL Mouth Rinse Q2H   thiamine  100 mg Oral Daily   Or   thiamine  100 mg Intravenous Daily   Continuous Infusions:  lactated ringers 75 mL/hr at 07/21/22 0043   levETIRAcetam Stopped (07/20/22 0034)     LOS: 2 days   Lonia Blood, MD [facility] Office  [phone] Pager - Text Page per Loretha Stapler  If 7PM-7AM, please contact night-coverage per Amion 07/21/2022,  9:53 AM

## 2022-07-22 LAB — COMPREHENSIVE METABOLIC PANEL
ALT: 23 U/L (ref 0–44)
AST: 32 U/L (ref 15–41)
Albumin: 3.8 g/dL (ref 3.5–5.0)
Alkaline Phosphatase: 44 U/L (ref 38–126)
Anion gap: 8 (ref 5–15)
BUN: 6 mg/dL (ref 6–20)
CO2: 25 mmol/L (ref 22–32)
Calcium: 9.4 mg/dL (ref 8.9–10.3)
Chloride: 101 mmol/L (ref 98–111)
Creatinine, Ser: 0.64 mg/dL (ref 0.61–1.24)
GFR, Estimated: >60 mL/min (ref >60)
Glucose, Bld: 115 mg/dL — ABNORMAL HIGH (ref 70–99)
Potassium: 4.3 mmol/L (ref 3.5–5.1)
Sodium: 134 mmol/L — ABNORMAL LOW (ref 135–145)
Total Bilirubin: 0.7 mg/dL (ref 0.3–1.2)
Total Protein: 7.5 g/dL (ref 6.5–8.1)

## 2022-07-22 LAB — CBC
HCT: 36.3 % — ABNORMAL LOW (ref 39.0–52.0)
Hemoglobin: 12.4 g/dL — ABNORMAL LOW (ref 13.0–17.0)
MCH: 26.6 pg (ref 26.0–34.0)
MCHC: 34.2 g/dL (ref 30.0–36.0)
MCV: 77.7 fL — ABNORMAL LOW (ref 80.0–100.0)
Platelets: 268 10*3/uL (ref 150–400)
RBC: 4.67 MIL/uL (ref 4.22–5.81)
RDW: 14.4 % (ref 11.5–15.5)
WBC: 9.8 10*3/uL (ref 4.0–10.5)
nRBC: 0 % (ref 0.0–0.2)

## 2022-07-22 LAB — PHOSPHORUS: Phosphorus: 3.5 mg/dL (ref 2.5–4.6)

## 2022-07-22 LAB — MAGNESIUM: Magnesium: 1.7 mg/dL (ref 1.7–2.4)

## 2022-07-22 MED ORDER — LEVETIRACETAM 500 MG PO TABS: 500.0000 mg | ORAL_TABLET | Freq: Two times a day (BID) | ORAL | 1 refills | Status: DC

## 2022-07-22 MED ORDER — VITAMIN B-12 1000 MCG PO TABS: 1000.0000 ug | ORAL_TABLET | Freq: Every day | ORAL | Status: DC

## 2022-07-22 MED ORDER — BUTALBITAL-APAP-CAFFEINE 50-325-40 MG PO TABS
1.0000 | ORAL_TABLET | Freq: Four times a day (QID) | ORAL | 0 refills | Status: DC | PRN
Start: 2022-07-22 — End: 2023-03-28

## 2022-07-22 MED ORDER — VITAMIN B-12 1000 MCG PO TABS
1000.0000 ug | ORAL_TABLET | Freq: Every day | ORAL | 1 refills | Status: DC
Start: 2022-07-22 — End: 2023-03-28

## 2022-07-22 NOTE — Anesthesia Postprocedure Evaluation (Signed)
Anesthesia Post Note  Patient: Jose Hernandez  Procedure(s) Performed: SAH embolization     Patient location during evaluation: Other Anesthesia Type: General Level of consciousness: awake and alert Pain management: pain level controlled Vital Signs Assessment: post-procedure vital signs reviewed and stable Respiratory status: spontaneous breathing, nonlabored ventilation and respiratory function stable Cardiovascular status: blood pressure returned to baseline and stable Postop Assessment: no apparent nausea or vomiting Anesthetic complications: no   No notable events documented.  Last Vitals:  Vitals:   07/22/22 0600 07/22/22 0800  BP: 112/68   Pulse: 77   Resp: 20   Temp:  36.8 C  SpO2: 96%     Last Pain:  Vitals:   07/22/22 0800  TempSrc: Axillary  PainSc:                  Jose Hernandez,W. EDMOND

## 2022-07-22 NOTE — Discharge Instructions (Addendum)
Wound Care    Do not put any creams, lotions, or ointments on incision. You are fine to shower. Let water run over incision and pat dry.  Activity Walk each and every day, increasing distance each day. No lifting greater than 5 lbs. No driving  Diet Resume your normal diet. No alcohol   Return to Work Will be discussed at your follow up appointment.  Call Your Doctor If Any of These Occur Temperature greater than 101 degrees. Severe pain not relieved by pain medication.  Go to the Emergency Department for: Any seizure activity Sudden worsening of headache Confusion  Follow Up Appt Call [phone] today for appointment in 2 weeks if you don't already have one or for any problems.

## 2022-07-22 NOTE — Discharge Summary (Signed)
Physician Discharge Summary     Providing Compassionate, Quality Care - Together   Patient ID: Jose Hernandez MRN: [id number] DOB/AGE: 05/06/1991 31 y.o.  Admit date: 07/19/2022 Discharge date: 07/22/2022  Admission Diagnoses: Subdural hemorrhage  Discharge Diagnoses:  Principal Problem:   Subdural hemorrhage (HCC) Active Problems:   Acute metabolic encephalopathy   Seizure (HCC)   Alcohol abuse   Microcytic anemia   Hypocalcemia   Discharged Condition: good  Hospital Course: Patient presented to the [facility] Emergency Department on 07/19/2022 with seizures and confusion. 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 He underwent an MMA embolization on 07/21/2022 with Dr. Conchita Paris. He was admitted to 4[facility] ICU  following recovery from anesthesia in the PACU. His postoperative course has been uncomplicated. He has remained seizure free. He is ambulating independently and without difficulty. He is tolerating a normal diet. His pain is well-controlled with oral pain medication. He was found to have B12 deficiency during his hospitalization. A prescription for oral B12 supplementation has been sent to the patient's pharmacy in addition to Keppra and Fioricet. He is ready for discharge home.   Consults:  Neurointerventional radiology , Hospitalists  Significant Diagnostic Studies: labs:  Results for orders placed or performed during the hospital encounter of 07/19/22 (from the past 24 hour(s))  MRSA Next Gen by PCR, Nasal     Status: None   Collection Time: 07/21/22  7:48 PM   Specimen: Nasal Mucosa; Nasal Swab  Result Value Ref Range   MRSA by PCR Next Gen NOT DETECTED NOT DETECTED  CBC     Status: Abnormal   Collection Time: 07/22/22  6:09 AM  Result Value Ref Range   WBC 9.8 4.0 - 10.5 K/uL   RBC 4.67 4.22 - 5.81 MIL/uL   Hemoglobin 12.4 (L) 13.0 - 17.0 g/dL   HCT 19.3 (L) 79.0 - 24.0 %   MCV 77.7 (L) 80.0 - 100.0 fL   MCH 26.6 26.0 - 34.0 pg   MCHC 34.2 30.0 - 36.0  g/dL   RDW 97.3 53.2 - 99.2 %   Platelets 268 150 - 400 K/uL   nRBC 0.0 0.0 - 0.2 %  Comprehensive metabolic panel     Status: Abnormal   Collection Time: 07/22/22  6:09 AM  Result Value Ref Range   Sodium 134 (L) 135 - 145 mmol/L   Potassium 4.3 3.5 - 5.1 mmol/L   Chloride 101 98 - 111 mmol/L   CO2 25 22 - 32 mmol/L   Glucose, Bld 115 (H) 70 - 99 mg/dL   BUN 6 6 - 20 mg/dL   Creatinine, Ser 4.26 0.61 - 1.24 mg/dL   Calcium 9.4 8.9 - 83.4 mg/dL   Total Protein 7.5 6.5 - 8.1 g/dL   Albumin 3.8 3.5 - 5.0 g/dL   AST 32 15 - 41 U/L   ALT 23 0 - 44 U/L   Alkaline Phosphatase 44 38 - 126 U/L   Total Bilirubin 0.7 0.3 - 1.2 mg/dL   GFR, Estimated >19 >62 mL/min   Anion gap 8 5 - 15  Magnesium     Status: None   Collection Time: 07/22/22  6:09 AM  Result Value Ref Range   Magnesium 1.7 1.7 - 2.4 mg/dL  Phosphorus     Status: None   Collection Time: 07/22/22  6:09 AM  Result Value Ref Range   Phosphorus 3.5 2.5 - 4.6 mg/dL    and radiology: IR ANGIO EXTERNAL CAROTID SEL EXT CAROTID  UNI L MOD SED  Result Date: 07/21/2022 PROCEDURE: ONYX EMBOLIZATION OF LEFT MIDDLE MENINGEAL ARTERY HISTORY: The patient is a 31 year old man initially presenting to the hospital with altered mental status/confusion. His workup included CT scan demonstrating a subacute to chronic left convexity subdural hematoma. Patient was seen by the on-call neurosurgeon and was not felt to be a good candidate for operative evacuation and because of his social situation was at high risk to be lost to follow-up in observation. Middle meningeal artery embolization was therefore requested. ACCESS: The technical aspects of the procedure as well as its potential risks and benefits were reviewed with the patient. These risks included but were not limited to stroke, intracranial hemorrhage, bleeding, infection, allergic reaction, damage to organs or vital structures, stroke, non-diagnostic procedure, and the catastrophic outcomes of  heart attack, coma, and death. With an understanding of these risks, informed consent was obtained and witnessed. The patient was placed in the supine position on the angiography table and the skin of right groin prepped in the usual sterile fashion. The procedure was performed under general anesthesia. A 5-French sheath was introduced in the right common femoral artery using Seldinger technique. MEDICATIONS: HEPARIN: 3000 Units total. CONTRAST:  cc, Omnipaque 300 FLUOROSCOPY TIME:  FLUOROSCOPY TIME: See IR records TECHNIQUE: CATHETERS AND WIRES 5-French cook JB-1 180 cm glidewire Synchro 10 microwire Apollo microcatheter LIQUID EMBOLIC USED Onyx-18 VESSELS CATHETERIZED Left internal carotid Left external Left middle meningeal artery VESSELS STUDIED Left external carotid, head Left internal carotid, head Left middle meningeal artery, head (pre-embolization) Left external carotid artery, head (final control) Right common femoral PROCEDURAL NARRATIVE A 5-Fr JB-1 glide catheter was advanced over a 0.035 glidewire into the aortic arch. The left internal carotid artery was catheterized. Cerebral angiogram taken. The left external carotid artery was then catheterized and external carotid angiogram was taken. No abnormal anastomoses were noted and I elected to proceed with the embolization. The Apollo microcatheter was introduced into the JB 1 catheter over the synchro 10 microwire. Under roadmap guidance, the anterior branch of the middle meningeal artery was easily catheterized. Microcatheter run was taken without any evidence of intracranial anastomoses. The catheter was flushed with DMSO. The left middle meningeal artery was then embolized with onyx 18 utilizing standard blank roadmap technique. Once there was significant onyx reflux into the more proximal middle meningeal artery I elected to complete the embolization. The Apollo microcatheter was removed without incident. Final control angiogram from the left common  carotid artery was taken. The JB 1 catheter was then removed without incident. FINDINGS: Left internal carotid, head: Injection reveals the presence of a widely patent ICA, M1, and A1 segments and their branches. The ophthalmic artery is noted to arise from its usual location in the proximal supraclinoid segment. No aneurysms, AVMs, or high-flow fistulas are seen. The parenchymal and venous phases are normal. The venous sinuses are widely patent. Left external carotid, head Visualized cranial branches of the left external carotid artery are unremarkable. Of note, there are no extracranial to intracranial anastomoses. There is no visualization of the intracranial dural venous sinuses. Left middle meningeal artery (pre embolization) Microcatheter run taken from the left middle meningeal artery does not reveal any intracranial anastomoses or pial perfusion. Left external carotid artery (final control) Onyx cast is seen in the left middle meningeal artery. The left middle meningeal artery appears to be occluded proximal to its entrance into the foramen spinosum. The remainder of the cranial branches of the left external carotid artery are unremarkable.  Right femoral: Normal vessel. No significant atherosclerotic disease. Arterial sheath in adequate position. DISPOSITION: Upon completion of the study, the femoral sheath was removed and hemostasis obtained using a 5-Fr ExoSeal closure device. Good proximal and distal lower extremity pulses were documented upon achievement of hemostasis. The procedure was well tolerated and no early complications were observed. The patient was transferred to the postanesthesia care unit in stable hemodynamic condition. IMPRESSION: 1. Successful embolization of the left middle meningeal artery for chronic subdural hematoma The preliminary results of this procedure were shared with the patient's family. Electronically Signed   By: Lisbeth Renshaw   On: 07/21/2022 17:27   IR  Transcath/Emboliz  Result Date: 07/21/2022 PROCEDURE: ONYX EMBOLIZATION OF LEFT MIDDLE MENINGEAL ARTERY HISTORY: The patient is a 31 year old man initially presenting to the hospital with altered mental status/confusion. His workup included CT scan demonstrating a subacute to chronic left convexity subdural hematoma. Patient was seen by the on-call neurosurgeon and was not felt to be a good candidate for operative evacuation and because of his social situation was at high risk to be lost to follow-up in observation. Middle meningeal artery embolization was therefore requested. ACCESS: The technical aspects of the procedure as well as its potential risks and benefits were reviewed with the patient. These risks included but were not limited to stroke, intracranial hemorrhage, bleeding, infection, allergic reaction, damage to organs or vital structures, stroke, non-diagnostic procedure, and the catastrophic outcomes of heart attack, coma, and death. With an understanding of these risks, informed consent was obtained and witnessed. The patient was placed in the supine position on the angiography table and the skin of right groin prepped in the usual sterile fashion. The procedure was performed under general anesthesia. A 5-French sheath was introduced in the right common femoral artery using Seldinger technique. MEDICATIONS: HEPARIN: 3000 Units total. CONTRAST:  cc, Omnipaque 300 FLUOROSCOPY TIME:  FLUOROSCOPY TIME: See IR records TECHNIQUE: CATHETERS AND WIRES 5-French cook JB-1 180 cm glidewire Synchro 10 microwire Apollo microcatheter LIQUID EMBOLIC USED Onyx-18 VESSELS CATHETERIZED Left internal carotid Left external Left middle meningeal artery VESSELS STUDIED Left external carotid, head Left internal carotid, head Left middle meningeal artery, head (pre-embolization) Left external carotid artery, head (final control) Right common femoral PROCEDURAL NARRATIVE A 5-Fr JB-1 glide catheter was advanced over a 0.035  glidewire into the aortic arch. The left internal carotid artery was catheterized. Cerebral angiogram taken. The left external carotid artery was then catheterized and external carotid angiogram was taken. No abnormal anastomoses were noted and I elected to proceed with the embolization. The Apollo microcatheter was introduced into the JB 1 catheter over the synchro 10 microwire. Under roadmap guidance, the anterior branch of the middle meningeal artery was easily catheterized. Microcatheter run was taken without any evidence of intracranial anastomoses. The catheter was flushed with DMSO. The left middle meningeal artery was then embolized with onyx 18 utilizing standard blank roadmap technique. Once there was significant onyx reflux into the more proximal middle meningeal artery I elected to complete the embolization. The Apollo microcatheter was removed without incident. Final control angiogram from the left common carotid artery was taken. The JB 1 catheter was then removed without incident. FINDINGS: Left internal carotid, head: Injection reveals the presence of a widely patent ICA, M1, and A1 segments and their branches. The ophthalmic artery is noted to arise from its usual location in the proximal supraclinoid segment. No aneurysms, AVMs, or high-flow fistulas are seen. The parenchymal and venous phases are normal. The venous  sinuses are widely patent. Left external carotid, head Visualized cranial branches of the left external carotid artery are unremarkable. Of note, there are no extracranial to intracranial anastomoses. There is no visualization of the intracranial dural venous sinuses. Left middle meningeal artery (pre embolization) Microcatheter run taken from the left middle meningeal artery does not reveal any intracranial anastomoses or pial perfusion. Left external carotid artery (final control) Onyx cast is seen in the left middle meningeal artery. The left middle meningeal artery appears to be  occluded proximal to its entrance into the foramen spinosum. The remainder of the cranial branches of the left external carotid artery are unremarkable. Right femoral: Normal vessel. No significant atherosclerotic disease. Arterial sheath in adequate position. DISPOSITION: Upon completion of the study, the femoral sheath was removed and hemostasis obtained using a 5-Fr ExoSeal closure device. Good proximal and distal lower extremity pulses were documented upon achievement of hemostasis. The procedure was well tolerated and no early complications were observed. The patient was transferred to the postanesthesia care unit in stable hemodynamic condition. IMPRESSION: 1. Successful embolization of the left middle meningeal artery for chronic subdural hematoma The preliminary results of this procedure were shared with the patient's family. Electronically Signed   By: Lisbeth Renshaw   On: 07/21/2022 17:27   IR ANGIO INTRA EXTRACRAN SEL INTERNAL CAROTID UNI L MOD SED  Result Date: 07/21/2022 PROCEDURE: ONYX EMBOLIZATION OF LEFT MIDDLE MENINGEAL ARTERY HISTORY: The patient is a 31 year old man initially presenting to the hospital with altered mental status/confusion. His workup included CT scan demonstrating a subacute to chronic left convexity subdural hematoma. Patient was seen by the on-call neurosurgeon and was not felt to be a good candidate for operative evacuation and because of his social situation was at high risk to be lost to follow-up in observation. Middle meningeal artery embolization was therefore requested. ACCESS: The technical aspects of the procedure as well as its potential risks and benefits were reviewed with the patient. These risks included but were not limited to stroke, intracranial hemorrhage, bleeding, infection, allergic reaction, damage to organs or vital structures, stroke, non-diagnostic procedure, and the catastrophic outcomes of heart attack, coma, and death. With an understanding of  these risks, informed consent was obtained and witnessed. The patient was placed in the supine position on the angiography table and the skin of right groin prepped in the usual sterile fashion. The procedure was performed under general anesthesia. A 5-French sheath was introduced in the right common femoral artery using Seldinger technique. MEDICATIONS: HEPARIN: 3000 Units total. CONTRAST:  cc, Omnipaque 300 FLUOROSCOPY TIME:  FLUOROSCOPY TIME: See IR records TECHNIQUE: CATHETERS AND WIRES 5-French cook JB-1 180 cm glidewire Synchro 10 microwire Apollo microcatheter LIQUID EMBOLIC USED Onyx-18 VESSELS CATHETERIZED Left internal carotid Left external Left middle meningeal artery VESSELS STUDIED Left external carotid, head Left internal carotid, head Left middle meningeal artery, head (pre-embolization) Left external carotid artery, head (final control) Right common femoral PROCEDURAL NARRATIVE A 5-Fr JB-1 glide catheter was advanced over a 0.035 glidewire into the aortic arch. The left internal carotid artery was catheterized. Cerebral angiogram taken. The left external carotid artery was then catheterized and external carotid angiogram was taken. No abnormal anastomoses were noted and I elected to proceed with the embolization. The Apollo microcatheter was introduced into the JB 1 catheter over the synchro 10 microwire. Under roadmap guidance, the anterior branch of the middle meningeal artery was easily catheterized. Microcatheter run was taken without any evidence of intracranial anastomoses. The catheter was flushed  with DMSO. The left middle meningeal artery was then embolized with onyx 18 utilizing standard blank roadmap technique. Once there was significant onyx reflux into the more proximal middle meningeal artery I elected to complete the embolization. The Apollo microcatheter was removed without incident. Final control angiogram from the left common carotid artery was taken. The JB 1 catheter was then  removed without incident. FINDINGS: Left internal carotid, head: Injection reveals the presence of a widely patent ICA, M1, and A1 segments and their branches. The ophthalmic artery is noted to arise from its usual location in the proximal supraclinoid segment. No aneurysms, AVMs, or high-flow fistulas are seen. The parenchymal and venous phases are normal. The venous sinuses are widely patent. Left external carotid, head Visualized cranial branches of the left external carotid artery are unremarkable. Of note, there are no extracranial to intracranial anastomoses. There is no visualization of the intracranial dural venous sinuses. Left middle meningeal artery (pre embolization) Microcatheter run taken from the left middle meningeal artery does not reveal any intracranial anastomoses or pial perfusion. Left external carotid artery (final control) Onyx cast is seen in the left middle meningeal artery. The left middle meningeal artery appears to be occluded proximal to its entrance into the foramen spinosum. The remainder of the cranial branches of the left external carotid artery are unremarkable. Right femoral: Normal vessel. No significant atherosclerotic disease. Arterial sheath in adequate position. DISPOSITION: Upon completion of the study, the femoral sheath was removed and hemostasis obtained using a 5-Fr ExoSeal closure device. Good proximal and distal lower extremity pulses were documented upon achievement of hemostasis. The procedure was well tolerated and no early complications were observed. The patient was transferred to the postanesthesia care unit in stable hemodynamic condition. IMPRESSION: 1. Successful embolization of the left middle meningeal artery for chronic subdural hematoma The preliminary results of this procedure were shared with the patient's family. Electronically Signed   By: Lisbeth Renshaw   On: 07/21/2022 17:27   CT HEAD WO CONTRAST ( )  Result Date: 07/20/2022 CLINICAL DATA:   31 year old male with left side subdural hematoma. Subsequent encounter. EXAM: CT HEAD WITHOUT CONTRAST TECHNIQUE: Contiguous axial images were obtained from the base of the skull through the vertex without intravenous contrast. RADIATION DOSE REDUCTION: This exam was performed according to the departmental dose-optimization program which includes automated exposure control, adjustment of the mA and/or kV according to patient size and/or use of iterative reconstruction technique. COMPARISON:  07/19/2022 and earlier. FINDINGS: Brain: Left side low to intermediate density subdural hematoma is stable since yesterday, up to 8 mm in thickness along the left superolateral convexity. Trace rightward midline shift is stable. Mild mass effect on the left lateral ventricle is stable. No superimposed ventriculomegaly, new intracranial hemorrhage or evidence of cortically based acute infarction. Gray-white matter differentiation is within normal limits throughout the brain. Basilar cisterns remain normal. Vascular: No suspicious intracranial vascular hyperdensity. Skull: Stable, intact. Sinuses/Orbits: Visualized paranasal sinuses and mastoids are stable and well aerated. Other: Visualized orbits and scalp soft tissues are within normal limits. IMPRESSION: 1. Stable Left Subdural Hematoma since yesterday, up to 8 mm in thickness. Stable mild intracranial mass effect with trace rightward midline shift. 2. No new intracranial abnormality. Electronically Signed   By: Odessa Fleming M.D.   On: 07/20/2022 14:50   EEG adult  Result Date: 07/20/2022 Charlsie Quest, MD     07/20/2022 11:41 AM Patient Name: Jose Hernandez MRN: [id number] Epilepsy Attending: Charlsie Quest Referring Physician/Provider: Margie Ege  A, DO Date: 07/20/2022 Duration: 26.48 mins Patient history: 31 year old male with history of alcoholism who presented with altered mental status and seizure-like episode in the setting of underlying chronic left subdural  hematoma.  EEG to evaluate for seizure. Level of alertness: Awake, asleep AEDs during EEG study: LEV Technical aspects: This EEG study was done with scalp electrodes positioned according to the 10-20 International system of electrode placement. Electrical activity was reviewed with band pass filter of 1-70Hz , sensitivity of 7 uV/mm, display speed of 3930mm/sec with a 60Hz  notched filter applied as appropriate. EEG data were recorded continuously and digitally stored.  Video monitoring was available and reviewed as appropriate. Description: The posterior dominant rhythm consists of 8-9 Hz activity of moderate voltage (25-35 uV) seen predominantly in posterior head regions, symmetric and reactive to eye opening and eye closing. Sleep was characterized by vertex waves, sleep spindles (12 to 14 Hz), maximal frontocentral region. Hyperventilation and photic stimulation were not performed.   IMPRESSION: This study is within normal limits. No seizures or epileptiform discharges were seen throughout the recording. A normal interictal EEG does not exclude nor support the diagnosis of epilepsy. Charlsie Questriyanka O Yadav   CT Head Wo Contrast  Result Date: 07/19/2022 CLINICAL DATA:  [facility] Per bystanders pt had seizure-like activity for an unknown amount of time. Hx of seizures and alcohol abuse. Pt reports taking seizure medication 2 days ago and taking Librium about 3 days ago. Pt reports drinking heavily last night. A&O x4 with a little slowness to respond. EXAM: CT HEAD WITHOUT CONTRAST TECHNIQUE: Contiguous axial images were obtained from the base of the skull through the vertex without intravenous contrast. RADIATION DOSE REDUCTION: This exam was performed according to the departmental dose-optimization program which includes automated exposure control, adjustment of the mA and/or kV according to patient size and/or use of iterative reconstruction technique. COMPARISON:  04/23/2022. FINDINGS: Brain: Small  left-sided subdural hemorrhage, thickness 6-7 mm, extending over the left frontal and parietal lobes, average Hounsfield units of 39, new since the prior exam. Ventricles are normal in size and configuration. There are no parenchymal or extra-axial masses. No evidence of an ischemic infarct. No other evidence of intracranial hemorrhage. Left subdural hemorrhage causes mild mass effect with midline shift to the right of 3-4 mm. Vascular: No hyperdense vessel or unexpected calcification. Skull: Normal. Negative for fracture or focal lesion. Sinuses/Orbits: Globes and orbits are unremarkable. The visualized sinuses are clear. Other: No evidence of a scalp contusion. IMPRESSION: 1. Small left-sided, recent, subdural hemorrhage, with mild mass effect, shifting the midline to the right by 3-4 mm. No parenchymal or subarachnoid hemorrhage. 2. No other acute or recent intracranial abnormality. Electronically Signed   By: Amie Portlandavid  Ormond M.D.   On: 07/19/2022 14:18     Treatments: surgery: Onyx embolization of left middle meningeal artery  Discharge Exam: Blood pressure 126/88, pulse 91, temperature 98.3 F (36.8 C), temperature source Axillary, resp. rate 13, SpO2 97 %.  Alert and oriented x 4 PERRLA CN II-XII grossly intact MAE, Strength and sensation intact Right groin site unremarkable  Disposition: Discharge disposition: 01-Home or Self Care       Discharge Instructions     Call MD for:  difficulty breathing, headache or visual disturbances   Complete by: As directed    Call MD for:  persistant dizziness or light-headedness   Complete by: As directed    Call MD for:  persistant nausea and vomiting   Complete by: As directed  Call MD for:  redness, tenderness, or signs of infection (pain, swelling, redness, odor or green/yellow discharge around incision site)   Complete by: As directed    Call MD for:  severe uncontrolled pain   Complete by: As directed    Call MD for:  temperature  >100.4   Complete by: As directed    Diet - low sodium heart healthy   Complete by: As directed    Increase activity slowly   Complete by: As directed    No wound care   Complete by: As directed    Remove dressing in 24 hours   Complete by: As directed    If dressing still present.      Allergies as of 07/22/2022   No Known Allergies      Medication List     TAKE these medications    butalbital-acetaminophen-caffeine 50-325-40 MG tablet Commonly known as: FIORICET Take 1-2 tablets by mouth every 6 (six) hours as needed for headache (Not to exceed 6 tablets per day.).   cyanocobalamin 1000 MCG tablet Commonly known as: VITAMIN B12 Take 1 tablet (1,000 mcg total) by mouth daily.   levETIRAcetam 500 MG tablet Commonly known as: KEPPRA Take 1 tablet (500 mg total) by mouth 2 (two) times daily.        Follow-up Information     Lisbeth Renshaw, MD. Schedule an appointment as soon as possible for a visit in 2 week(s).   Specialty: Neurosurgery Contact information: [address] [place] [address] [phone]                 Signed: Val Eagle, DNP, AGNP-C Nurse Practitioner  [facility] [address], [address], [place], [place] [address] P: [phone]    F: [phone]  07/22/2022, 10:17 AM

## 2022-07-22 NOTE — Progress Notes (Addendum)
Per RN, pt needs a PCP. Met with pt at bedside. He plans to return home with the support of his girlfriend. Encouraged pt to contact his insurance and request assistance finding a PCP or he can ask his family if they can recommend a PCP and to make sure that they are in network with his insurance. He reports that he just got his medical insurance and he didn't know what he needed to do to find a PCP. He reports that he also has insurance coverage for his meds. Encouraged pt to contact [employer] on Monday. Discussed the importance of following with a physician after being D/C from the hospital. He verbalized understanding.

## 2022-07-24 ENCOUNTER — Encounter (HOSPITAL_COMMUNITY): Payer: Self-pay | Admitting: Neurosurgery

## 2022-09-18 ENCOUNTER — Other Ambulatory Visit: Payer: Self-pay | Admitting: Student

## 2022-09-18 DIAGNOSIS — S065XAA Traumatic subdural hemorrhage with loss of consciousness status unknown, initial encounter: Secondary | ICD-10-CM

## 2022-09-21 ENCOUNTER — Ambulatory Visit
Admission: RE | Admit: 2022-09-21 | Discharge: 2022-09-21 | Disposition: A | Discharge status: Home / Self Care | Payer: 59 | Source: Ambulatory Visit | Attending: Student | Admitting: Student

## 2022-09-21 DIAGNOSIS — S065XAA Traumatic subdural hemorrhage with loss of consciousness status unknown, initial encounter: Secondary | ICD-10-CM

## 2022-10-18 ENCOUNTER — Other Ambulatory Visit (HOSPITAL_COMMUNITY): Payer: Self-pay

## 2022-10-18 ENCOUNTER — Encounter (HOSPITAL_COMMUNITY): Payer: Self-pay | Admitting: Interventional Radiology

## 2022-10-18 ENCOUNTER — Other Ambulatory Visit (HOSPITAL_COMMUNITY): Payer: Self-pay | Admitting: Student

## 2022-10-18 DIAGNOSIS — S065XAA Traumatic subdural hemorrhage with loss of consciousness status unknown, initial encounter: Secondary | ICD-10-CM

## 2022-11-15 ENCOUNTER — Ambulatory Visit (HOSPITAL_COMMUNITY)
Admission: RE | Admit: 2022-11-15 | Discharge: 2022-11-15 | Disposition: A | Discharge status: Home / Self Care | Payer: 59 | Source: Ambulatory Visit | Attending: Student | Admitting: Student

## 2022-11-15 DIAGNOSIS — S065XAA Traumatic subdural hemorrhage with loss of consciousness status unknown, initial encounter: Secondary | ICD-10-CM | POA: Insufficient documentation

## 2022-12-06 ENCOUNTER — Encounter (HOSPITAL_COMMUNITY): Payer: Self-pay

## 2022-12-06 ENCOUNTER — Other Ambulatory Visit: Payer: Self-pay

## 2022-12-06 ENCOUNTER — Emergency Department (HOSPITAL_COMMUNITY)
Admission: EM | Admit: 2022-12-06 | Discharge: 2022-12-07 | Disposition: A | Discharge status: Home / Self Care | Payer: 59 | Attending: Emergency Medicine | Admitting: Emergency Medicine

## 2022-12-06 DIAGNOSIS — T69022A Immersion foot, left foot, initial encounter: Secondary | ICD-10-CM | POA: Insufficient documentation

## 2022-12-06 DIAGNOSIS — X31XXXA Exposure to excessive natural cold, initial encounter: Secondary | ICD-10-CM | POA: Insufficient documentation

## 2022-12-06 DIAGNOSIS — T699XXA Effect of reduced temperature, unspecified, initial encounter: Secondary | ICD-10-CM

## 2022-12-06 DIAGNOSIS — T69029A Immersion foot, unspecified foot, initial encounter: Secondary | ICD-10-CM

## 2022-12-06 DIAGNOSIS — T69021A Immersion foot, right foot, initial encounter: Secondary | ICD-10-CM | POA: Insufficient documentation

## 2022-12-06 LAB — BASIC METABOLIC PANEL
Anion gap: 10 (ref 5–15)
BUN: <5 mg/dL — ABNORMAL LOW (ref 6–20)
CO2: 26 mmol/L (ref 22–32)
Calcium: 8.6 mg/dL — ABNORMAL LOW (ref 8.9–10.3)
Chloride: 101 mmol/L (ref 98–111)
Creatinine, Ser: 0.71 mg/dL (ref 0.61–1.24)
GFR, Estimated: >60 mL/min (ref >60)
Glucose, Bld: 112 mg/dL — ABNORMAL HIGH (ref 70–99)
Potassium: 3.3 mmol/L — ABNORMAL LOW (ref 3.5–5.1)
Sodium: 137 mmol/L (ref 135–145)

## 2022-12-06 LAB — CBC WITH DIFFERENTIAL/PLATELET
Abs Immature Granulocytes: 0.02 10*3/uL (ref 0.00–0.07)
Basophils Absolute: 0 10*3/uL (ref 0.0–0.1)
Basophils Relative: 0 %
Eosinophils Absolute: 0.1 10*3/uL (ref 0.0–0.5)
Eosinophils Relative: 1 %
HCT: 34.9 % — ABNORMAL LOW (ref 39.0–52.0)
Hemoglobin: 11.3 g/dL — ABNORMAL LOW (ref 13.0–17.0)
Immature Granulocytes: 0 %
Lymphocytes Relative: 29 %
Lymphs Abs: 1.6 10*3/uL (ref 0.7–4.0)
MCH: 24.9 pg — ABNORMAL LOW (ref 26.0–34.0)
MCHC: 32.4 g/dL (ref 30.0–36.0)
MCV: 77 fL — ABNORMAL LOW (ref 80.0–100.0)
Monocytes Absolute: 0.9 10*3/uL (ref 0.1–1.0)
Monocytes Relative: 15 %
Neutro Abs: 3 10*3/uL (ref 1.7–7.7)
Neutrophils Relative %: 55 %
Platelets: 335 10*3/uL (ref 150–400)
RBC: 4.53 MIL/uL (ref 4.22–5.81)
RDW: 17.2 % — ABNORMAL HIGH (ref 11.5–15.5)
WBC: 5.6 10*3/uL (ref 4.0–10.5)
nRBC: 0 % (ref 0.0–0.2)

## 2022-12-06 MED ORDER — IBUPROFEN 800 MG PO TABS
800.0000 mg | ORAL_TABLET | Freq: Once | ORAL | Status: AC
  Administered 2022-12-07: 800 mg via ORAL
  Filled 2022-12-06: qty 1
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
## 2022-12-07 MED ORDER — ACETAMINOPHEN 325 MG PO TABS
650.0000 mg | ORAL_TABLET | Freq: Once | ORAL | Status: AC
  Administered 2022-12-07: 650 mg via ORAL
  Filled 2022-12-07: qty 2

## 2022-12-07 NOTE — Discharge Instructions (Addendum)
Try to keep your feet warm and dry.  Make sure to change your socks and shoes if they get wet.  Try to keep your feet elevated.  Take Tylenol or ibuprofen as needed for pain.
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
## 2022-12-24 ENCOUNTER — Emergency Department (HOSPITAL_COMMUNITY)
Admission: EM | Admit: 2022-12-24 | Discharge: 2022-12-25 | Disposition: A | Discharge status: Home / Self Care | Payer: Self-pay | Attending: Emergency Medicine | Admitting: Emergency Medicine

## 2022-12-24 ENCOUNTER — Other Ambulatory Visit: Payer: Self-pay

## 2022-12-24 ENCOUNTER — Emergency Department (HOSPITAL_COMMUNITY): Payer: Self-pay

## 2022-12-24 DIAGNOSIS — F1110 Opioid abuse, uncomplicated: Secondary | ICD-10-CM | POA: Insufficient documentation

## 2022-12-24 DIAGNOSIS — F10920 Alcohol use, unspecified with intoxication, uncomplicated: Secondary | ICD-10-CM

## 2022-12-24 DIAGNOSIS — F10129 Alcohol abuse with intoxication, unspecified: Secondary | ICD-10-CM | POA: Insufficient documentation

## 2022-12-24 DIAGNOSIS — G40909 Epilepsy, unspecified, not intractable, without status epilepticus: Secondary | ICD-10-CM

## 2022-12-24 LAB — CBC WITH DIFFERENTIAL/PLATELET
Abs Immature Granulocytes: 0 10*3/uL (ref 0.00–0.07)
Basophils Absolute: 0 10*3/uL (ref 0.0–0.1)
Basophils Relative: 1 %
Eosinophils Absolute: 0 10*3/uL (ref 0.0–0.5)
Eosinophils Relative: 0 %
HCT: 39.9 % (ref 39.0–52.0)
Hemoglobin: 12.8 g/dL — ABNORMAL LOW (ref 13.0–17.0)
Immature Granulocytes: 0 %
Lymphocytes Relative: 36 %
Lymphs Abs: 2 10*3/uL (ref 0.7–4.0)
MCH: 25.1 pg — ABNORMAL LOW (ref 26.0–34.0)
MCHC: 32.1 g/dL (ref 30.0–36.0)
MCV: 78.4 fL — ABNORMAL LOW (ref 80.0–100.0)
Monocytes Absolute: 0.4 10*3/uL (ref 0.1–1.0)
Monocytes Relative: 8 %
Neutro Abs: 3 10*3/uL (ref 1.7–7.7)
Neutrophils Relative %: 55 %
Platelets: 283 10*3/uL (ref 150–400)
RBC: 5.09 MIL/uL (ref 4.22–5.81)
RDW: 18.5 % — ABNORMAL HIGH (ref 11.5–15.5)
WBC: 5.4 10*3/uL (ref 4.0–10.5)
nRBC: 0 % (ref 0.0–0.2)

## 2022-12-24 LAB — URINALYSIS, ROUTINE W REFLEX MICROSCOPIC
Bacteria, UA: NONE SEEN
Bilirubin Urine: NEGATIVE
Glucose, UA: NEGATIVE mg/dL
Hgb urine dipstick: NEGATIVE
Ketones, ur: NEGATIVE mg/dL
Leukocytes,Ua: NEGATIVE
Nitrite: NEGATIVE
Protein, ur: NEGATIVE mg/dL
Specific Gravity, Urine: 1.005 (ref 1.005–1.030)
pH: 6 (ref 5.0–8.0)

## 2022-12-24 LAB — RAPID URINE DRUG SCREEN, HOSP PERFORMED
Amphetamines: NOT DETECTED
Barbiturates: NOT DETECTED
Benzodiazepines: NOT DETECTED
Cocaine: NOT DETECTED
Opiates: POSITIVE — AB
Tetrahydrocannabinol: NOT DETECTED

## 2022-12-24 LAB — COMPREHENSIVE METABOLIC PANEL
ALT: 49 U/L — ABNORMAL HIGH (ref 0–44)
AST: 49 U/L — ABNORMAL HIGH (ref 15–41)
Albumin: 4 g/dL (ref 3.5–5.0)
Alkaline Phosphatase: 48 U/L (ref 38–126)
Anion gap: 13 (ref 5–15)
BUN: 6 mg/dL (ref 6–20)
CO2: 29 mmol/L (ref 22–32)
Calcium: 8.6 mg/dL — ABNORMAL LOW (ref 8.9–10.3)
Chloride: 101 mmol/L (ref 98–111)
Creatinine, Ser: 0.79 mg/dL (ref 0.61–1.24)
GFR, Estimated: >60 mL/min (ref >60)
Glucose, Bld: 101 mg/dL — ABNORMAL HIGH (ref 70–99)
Potassium: 3.6 mmol/L (ref 3.5–5.1)
Sodium: 143 mmol/L (ref 135–145)
Total Bilirubin: 0.7 mg/dL (ref 0.3–1.2)
Total Protein: 8.2 g/dL — ABNORMAL HIGH (ref 6.5–8.1)

## 2022-12-24 LAB — MAGNESIUM: Magnesium: 2.2 mg/dL (ref 1.7–2.4)

## 2022-12-24 LAB — CBG MONITORING, ED: Glucose-Capillary: 105 mg/dL — ABNORMAL HIGH (ref 70–99)

## 2022-12-24 LAB — ETHANOL: Alcohol, Ethyl (B): 495 mg/dL (ref <10)

## 2022-12-24 MED ORDER — MORPHINE SULFATE (PF) 4 MG/ML IV SOLN
4.0000 mg | Freq: Once | INTRAVENOUS | Status: AC
  Administered 2022-12-24: 4 mg via INTRAVENOUS
  Filled 2022-12-24: qty 1

## 2022-12-24 MED ORDER — LEVETIRACETAM 500 MG PO TABS: 500.0000 mg | ORAL_TABLET | Freq: Two times a day (BID) | ORAL | 1 refills | Status: DC

## 2022-12-24 MED ORDER — LEVETIRACETAM IN NACL 1000 MG/100ML IV SOLN
1000.0000 mg | Freq: Once | INTRAVENOUS | Status: AC
  Administered 2022-12-24: 1000 mg via INTRAVENOUS
  Filled 2022-12-24: qty 100

## 2022-12-24 MED ORDER — SODIUM CHLORIDE 0.9 % IV SOLN: INTRAVENOUS | Status: DC

## 2022-12-24 MED ORDER — SODIUM CHLORIDE 0.9 % IV BOLUS
1000.0000 mL | Freq: Once | INTRAVENOUS | Status: AC
  Administered 2022-12-24: 1000 mL via INTRAVENOUS

## 2022-12-24 MED ORDER — ONDANSETRON HCL 4 MG/2ML IJ SOLN
4.0000 mg | Freq: Once | INTRAMUSCULAR | Status: AC
  Administered 2022-12-24: 4 mg via INTRAVENOUS
  Filled 2022-12-24: qty 2

## 2022-12-24 NOTE — ED Triage Notes (Signed)
[facility]. Staff found pt at bottom of stairs and report possible seizure. Pt hx seizures. Pt denies pain. Pt reports last dose of keppra in December. Pt aox2
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
## 2022-12-24 NOTE — Discharge Instructions (Addendum)
Avoid alcohol.  No driving until seen by neurology and seizure free for 6 months.  You need to establish care with a PCP.  You need to establish care with a neurologist.

## 2022-12-25 NOTE — ED Provider Notes (Signed)
6:39 AM Patient has been awake and ambulatory.  His vital signs are within normal limits.  He appears stable for discharge.   Infiniti Hoefling, Jenny Reichmann, MD 12/25/22 [phone]

## 2023-02-15 ENCOUNTER — Other Ambulatory Visit: Payer: Self-pay

## 2023-02-15 ENCOUNTER — Encounter (HOSPITAL_COMMUNITY): Payer: Self-pay

## 2023-02-15 ENCOUNTER — Emergency Department (HOSPITAL_COMMUNITY)
Admission: EM | Admit: 2023-02-15 | Discharge: 2023-02-16 | Disposition: A | Discharge status: Home / Self Care | Payer: Self-pay | Attending: Emergency Medicine | Admitting: Emergency Medicine

## 2023-02-15 DIAGNOSIS — F10920 Alcohol use, unspecified with intoxication, uncomplicated: Secondary | ICD-10-CM

## 2023-02-15 DIAGNOSIS — S0081XA Abrasion of other part of head, initial encounter: Secondary | ICD-10-CM | POA: Insufficient documentation

## 2023-02-15 DIAGNOSIS — Y908 Blood alcohol level of 240 mg/100 ml or more: Secondary | ICD-10-CM | POA: Insufficient documentation

## 2023-02-15 DIAGNOSIS — W01198A Fall on same level from slipping, tripping and stumbling with subsequent striking against other object, initial encounter: Secondary | ICD-10-CM | POA: Insufficient documentation

## 2023-02-15 DIAGNOSIS — F10129 Alcohol abuse with intoxication, unspecified: Secondary | ICD-10-CM | POA: Insufficient documentation

## 2023-02-15 LAB — COMPREHENSIVE METABOLIC PANEL
ALT: 28 U/L (ref 0–44)
AST: 44 U/L — ABNORMAL HIGH (ref 15–41)
Albumin: 4 g/dL (ref 3.5–5.0)
Alkaline Phosphatase: 50 U/L (ref 38–126)
Anion gap: 16 — ABNORMAL HIGH (ref 5–15)
BUN: 8 mg/dL (ref 6–20)
CO2: 25 mmol/L (ref 22–32)
Calcium: 8.9 mg/dL (ref 8.9–10.3)
Chloride: 97 mmol/L — ABNORMAL LOW (ref 98–111)
Creatinine, Ser: 0.88 mg/dL (ref 0.61–1.24)
GFR, Estimated: >60 mL/min (ref >60)
Glucose, Bld: 97 mg/dL (ref 70–99)
Potassium: 3.4 mmol/L — ABNORMAL LOW (ref 3.5–5.1)
Sodium: 138 mmol/L (ref 135–145)
Total Bilirubin: 1 mg/dL (ref 0.3–1.2)
Total Protein: 7.8 g/dL (ref 6.5–8.1)

## 2023-02-15 LAB — CBC WITH DIFFERENTIAL/PLATELET
Abs Immature Granulocytes: 0.02 10*3/uL (ref 0.00–0.07)
Basophils Absolute: 0.1 10*3/uL (ref 0.0–0.1)
Basophils Relative: 1 %
Eosinophils Absolute: 0 10*3/uL (ref 0.0–0.5)
Eosinophils Relative: 0 %
HCT: 37 % — ABNORMAL LOW (ref 39.0–52.0)
Hemoglobin: 12.5 g/dL — ABNORMAL LOW (ref 13.0–17.0)
Immature Granulocytes: 0 %
Lymphocytes Relative: 26 %
Lymphs Abs: 2 10*3/uL (ref 0.7–4.0)
MCH: 25.2 pg — ABNORMAL LOW (ref 26.0–34.0)
MCHC: 33.8 g/dL (ref 30.0–36.0)
MCV: 74.6 fL — ABNORMAL LOW (ref 80.0–100.0)
Monocytes Absolute: 0.5 10*3/uL (ref 0.1–1.0)
Monocytes Relative: 6 %
Neutro Abs: 5.2 10*3/uL (ref 1.7–7.7)
Neutrophils Relative %: 67 %
Platelets: 417 10*3/uL — ABNORMAL HIGH (ref 150–400)
RBC: 4.96 MIL/uL (ref 4.22–5.81)
RDW: 15.8 % — ABNORMAL HIGH (ref 11.5–15.5)
WBC: 7.8 10*3/uL (ref 4.0–10.5)
nRBC: 0 % (ref 0.0–0.2)

## 2023-02-15 LAB — ETHANOL: Alcohol, Ethyl (B): 456 mg/dL (ref <10)

## 2023-02-15 NOTE — ED Provider Notes (Signed)
[facility] Provider Note   CSN: [id number] Arrival date & time: 02/15/23  2239     History  Chief Complaint  Patient presents with   Alcohol Intoxication   Fall    Patient to Ed via EMS with complaint of falling while intoxicated. Patient arrives to ED with C-collar on and abrasion on forehead. Patient reports he passed out and fell.    Jose Hernandez is a 32 y.o. male.  The history is provided by the patient, medical records and the EMS personnel.  Alcohol Intoxication  Fall   32 y.o. M with hx of alcohol abuse, seizure disorder, presenting to the ED after a fall.  Patient was drinking tonight, fell and struck his head.  Patient thinks he may have passed out and then fell.  He also has hx of seizures, has had several recently-- unclear if this occurred tonight.  He reports he has been compliant with his keppra.  He reports pain along left side of his head, does have large abrasion to left forehead.  He is not currently on anticoagulation.  Does have have of SDH.  Home Medications Prior to Admission medications   Medication Sig Start Date End Date Taking? Authorizing Provider  butalbital-acetaminophen-caffeine (FIORICET) 50-325-40 MG tablet Take 1-2 tablets by mouth every 6 (six) hours as needed for headache (Not to exceed 6 tablets per day.). 07/22/22   Viona Gilmore D, NP  cyanocobalamin (VITAMIN B12) 1000 MCG tablet Take 1 tablet (1,000 mcg total) by mouth daily. 07/22/22   Viona Gilmore D, NP  levETIRAcetam (KEPPRA) 500 MG tablet Take 1 tablet (500 mg total) by mouth 2 (two) times daily. 12/24/22   Isla Pence, MD      Allergies    Patient has no known allergies.    Review of Systems   Review of Systems  Skin:  Positive for wound.  Psychiatric/Behavioral:         EtOH  All other systems reviewed and are negative.   Physical Exam Updated Vital Signs BP 132/86   Pulse (!) 102   Temp (!) 97.5 F (36.4 C) (Oral)    Resp 20   Ht 5\' 9"  (1.753 m)   Wt 77 kg   SpO2 97%   BMI 25.07 kg/m   Physical Exam Vitals and nursing note reviewed.  Constitutional:      Appearance: He is well-developed.  HENT:     Head: Normocephalic and atraumatic.     Comments: Large, diagonal abrasion to left forehead with smaller secondary abrasion adjacent, small hematoma present, no skull depression Eyes:     Conjunctiva/sclera: Conjunctivae normal.     Pupils: Pupils are equal, round, and reactive to light.  Neck:     Comments: C-collar in place Cardiovascular:     Rate and Rhythm: Normal rate and regular rhythm.     Heart sounds: Normal heart sounds.  Pulmonary:     Effort: Pulmonary effort is normal.     Breath sounds: Normal breath sounds.  Abdominal:     General: Bowel sounds are normal.     Palpations: Abdomen is soft.  Musculoskeletal:        General: Normal range of motion.  Skin:    General: Skin is warm and dry.  Neurological:     Mental Status: He is alert and oriented to person, place, and time.     Comments: Appears intoxicated but AAOx3, able to answer questions and follow commands, moving extremities  well     ED Results / Procedures / Treatments   Labs (all labs ordered are listed, but only abnormal results are displayed) Labs Reviewed  CBC WITH DIFFERENTIAL/PLATELET - Abnormal; Notable for the following components:      Result Value   Hemoglobin 12.5 (*)    HCT 37.0 (*)    MCV 74.6 (*)    MCH 25.2 (*)    RDW 15.8 (*)    Platelets 417 (*)    All other components within normal limits  COMPREHENSIVE METABOLIC PANEL - Abnormal; Notable for the following components:   Potassium 3.4 (*)    Chloride 97 (*)    AST 44 (*)    Anion gap 16 (*)    All other components within normal limits  ETHANOL - Abnormal; Notable for the following components:   Alcohol, Ethyl (B) 456 (*)    All other components within normal limits  RAPID URINE DRUG SCREEN, HOSP PERFORMED    EKG None  Radiology CT  HEAD WO CONTRAST (5MM)  Result Date: 02/16/2023 CLINICAL DATA:  Head trauma, abnormal mental status (Age 66-64y) head trauma, intoxication; Neck trauma, dangerous injury mechanism (Age 40-64y) EXAM: CT HEAD WITHOUT CONTRAST CT CERVICAL SPINE WITHOUT CONTRAST TECHNIQUE: Multidetector CT imaging of the head and cervical spine was performed following the standard protocol without intravenous contrast. Multiplanar CT image reconstructions of the cervical spine were also generated. RADIATION DOSE REDUCTION: This exam was performed according to the departmental dose-optimization program which includes automated exposure control, adjustment of the mA and/or kV according to patient size and/or use of iterative reconstruction technique. COMPARISON:  CT head 12/24/2022 FINDINGS: CT HEAD FINDINGS Brain: No evidence of large-territorial acute infarction. No parenchymal hemorrhage. No mass lesion. No extra-axial collection. No mass effect or midline shift. No hydrocephalus. Basilar cisterns are patent. Vascular: No hyperdense vessel. 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 Fluid extends into the left orbit, unchanged. Skull: No acute fracture or focal lesion. Sinuses/Orbits: Bilateral mild maxillary sinus mucosal thickening. Paranasal sinuses and mastoid air cells are clear. The orbits are unremarkable. Other: None. CT CERVICAL SPINE FINDINGS Alignment: Normal. Skull base and vertebrae: No acute fracture. No aggressive appearing focal osseous lesion or focal pathologic process. Soft tissues and spinal canal: No prevertebral fluid or swelling. No visible canal hematoma. Upper chest: Unremarkable. Other: None. IMPRESSION: 1. No acute intracranial abnormality. 2. No acute displaced fracture or traumatic listhesis of the cervical spine. Electronically Signed   By: Iven Finn M.D.   On: 02/16/2023 00:51   CT Cervical Spine Wo Contrast  Result Date: 02/16/2023 CLINICAL DATA:  Head trauma,  abnormal mental status (Age 47-64y) head trauma, intoxication; Neck trauma, dangerous injury mechanism (Age 40-64y) EXAM: CT HEAD WITHOUT CONTRAST CT CERVICAL SPINE WITHOUT CONTRAST TECHNIQUE: Multidetector CT imaging of the head and cervical spine was performed following the standard protocol without intravenous contrast. Multiplanar CT image reconstructions of the cervical spine were also generated. RADIATION DOSE REDUCTION: This exam was performed according to the departmental dose-optimization program which includes automated exposure control, adjustment of the mA and/or kV according to patient size and/or use of iterative reconstruction technique. COMPARISON:  CT head 12/24/2022 FINDINGS: CT HEAD FINDINGS Brain: No evidence of large-territorial acute infarction. No parenchymal hemorrhage. No mass lesion. No extra-axial collection. No mass effect or midline shift. No hydrocephalus. Basilar cisterns are patent. Vascular: No hyperdense vessel. 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 Fluid extends into the left orbit, unchanged.  Skull: No acute fracture or focal lesion. Sinuses/Orbits: Bilateral mild maxillary sinus mucosal thickening. Paranasal sinuses and mastoid air cells are clear. The orbits are unremarkable. Other: None. CT CERVICAL SPINE FINDINGS Alignment: Normal. Skull base and vertebrae: No acute fracture. No aggressive appearing focal osseous lesion or focal pathologic process. Soft tissues and spinal canal: No prevertebral fluid or swelling. No visible canal hematoma. Upper chest: Unremarkable. Other: None. IMPRESSION: 1. No acute intracranial abnormality. 2. No acute displaced fracture or traumatic listhesis of the cervical spine. Electronically Signed   By: Iven Finn M.D.   On: 02/16/2023 00:51    Procedures Procedures    Medications Ordered in ED Medications - No data to display  ED Course/ Medical Decision Making/ A&P                             Medical  Decision Making Amount and/or Complexity of Data Reviewed Labs: ordered. Radiology: ordered and independent interpretation performed. ECG/medicine tests: ordered and independent interpretation performed.   32 year old male presenting to the ED after a fall.  Has been drinking heavily tonight and apparently fell and struck his forehead.  It sounds like there was loss of consciousness.  He does have a large abrasion to left forehead, small hematoma without skull depression or deformity.  C-collar is in place.  He denies any other injuries.  Will obtain labs, CT head and neck.  He is not currently on anticoagulation but does have history of subdural hematoma in the past.  CT head/neck negative.  Labs as above-- ethanol 456.  He does have some slight electrolyte derangements which I suspect is likely secondary to his alcohol abuse.  Will need to metabolize.  5:36 AM Patient reassessed.  Still quite intoxicated.  Opens his eyes to voice but only for a second before falling back asleep.  He will need to metabolize further.  Care will be signed out to morning team.  Anticipate discharge once clinically sober.  ED Diagnoses Final diagnoses:  Alcoholic intoxication without complication [facility])    Rx / [place] Orders ED Discharge Orders     None         Larene Pickett, PA-C 02/16/23 [id number]    Merrily Pew, MD 02/16/23 [phone]

## 2023-02-16 ENCOUNTER — Emergency Department (HOSPITAL_COMMUNITY): Payer: Self-pay

## 2023-02-16 NOTE — ED Notes (Signed)
Pt currently sleeping. Normal rise and fall of chest noted. No signs of distress at this time.

## 2023-02-16 NOTE — ED Provider Notes (Signed)
  Physical Exam  BP (!) 141/80   Pulse (!) 113   Temp 98.7 F (37.1 C) (Oral)   Resp 18   Ht 5\' 9"  (1.753 m)   Wt 77 kg   SpO2 98%   BMI 25.07 kg/m   Physical Exam Vitals and nursing note reviewed.  Constitutional:      General: He is not in acute distress.    Appearance: He is well-developed.  HENT:     Head: Normocephalic.     Comments: Superficial abrasions to patient forehead Eyes:     Conjunctiva/sclera: Conjunctivae normal.  Cardiovascular:     Rate and Rhythm: Normal rate and regular rhythm.     Heart sounds: No murmur heard. Pulmonary:     Effort: Pulmonary effort is normal. No respiratory distress.     Breath sounds: Normal breath sounds.  Abdominal:     Palpations: Abdomen is soft.     Tenderness: There is no abdominal tenderness.  Musculoskeletal:        General: No swelling.     Cervical back: Neck supple.  Skin:    General: Skin is warm and dry.     Capillary Refill: Capillary refill takes less than 2 seconds.  Neurological:     Mental Status: He is alert.  Psychiatric:        Mood and Affect: Mood normal.     Procedures  Procedures  ED Course / MDM    Medical Decision Making Amount and/or Complexity of Data Reviewed Labs: ordered. Radiology: ordered. ECG/medicine tests: ordered.   32YOM signed out to me at shift change pending reassessment.  Please see previous provider note for further details.  In short this is a 32 year old male who presents to the ED intoxicated.  Patient lab work is unremarkable.  Patient was signed out to me pending reassessment.  On reassessment patient reports he feels better at this time.  Patient has steady gait, clinically sober.  Patient does have superficial abrasions on forehead which were covered with dressing.  Patient discharged in stable condition.  Patient advised to return with any new or worsening symptoms.  Patient advised to follow-up with PCP.       Azucena Cecil, PA-C 02/16/23 1135     Pattricia Boss, MD 02/16/23 [phone]

## 2023-02-16 NOTE — ED Notes (Signed)
Bandage placed on abrasion on pt's forehead.

## 2023-02-16 NOTE — Discharge Instructions (Signed)
Drink responsibly. Keep forehead wound clean with soap and warm water.  Can apply neosporin or similar. Return here for new concerns.

## 2023-02-16 NOTE — ED Notes (Signed)
AVS reviewed with pt prior to discharge. Pt verbalizes understanding. Belongings with pt upon depart. Pt taken to lobby via wheelchair to wait for ride. VSS

## 2023-03-28 ENCOUNTER — Encounter (HOSPITAL_COMMUNITY): Payer: Self-pay

## 2023-03-28 ENCOUNTER — Other Ambulatory Visit: Payer: Self-pay

## 2023-03-28 ENCOUNTER — Emergency Department (HOSPITAL_COMMUNITY): Payer: Medicaid Other

## 2023-03-28 ENCOUNTER — Inpatient Hospital Stay (HOSPITAL_COMMUNITY)
Admission: EM | Admit: 2023-03-28 | Discharge: 2023-03-30 | DRG: 101 | Disposition: A | Discharge status: Home / Self Care | Payer: Medicaid Other | Attending: Internal Medicine | Admitting: Internal Medicine

## 2023-03-28 DIAGNOSIS — F10929 Alcohol use, unspecified with intoxication, unspecified: Secondary | ICD-10-CM

## 2023-03-28 DIAGNOSIS — T426X6A Underdosing of other antiepileptic and sedative-hypnotic drugs, initial encounter: Secondary | ICD-10-CM | POA: Diagnosis not present

## 2023-03-28 DIAGNOSIS — R0902 Hypoxemia: Secondary | ICD-10-CM | POA: Diagnosis present

## 2023-03-28 DIAGNOSIS — Z87891 Personal history of nicotine dependence: Secondary | ICD-10-CM

## 2023-03-28 DIAGNOSIS — G40901 Epilepsy, unspecified, not intractable, with status epilepticus: Principal | ICD-10-CM | POA: Diagnosis present

## 2023-03-28 DIAGNOSIS — F1010 Alcohol abuse, uncomplicated: Secondary | ICD-10-CM | POA: Diagnosis present

## 2023-03-28 DIAGNOSIS — R569 Unspecified convulsions: Secondary | ICD-10-CM

## 2023-03-28 DIAGNOSIS — F10129 Alcohol abuse with intoxication, unspecified: Secondary | ICD-10-CM | POA: Diagnosis present

## 2023-03-28 DIAGNOSIS — Y908 Blood alcohol level of 240 mg/100 ml or more: Secondary | ICD-10-CM | POA: Diagnosis present

## 2023-03-28 DIAGNOSIS — Z79899 Other long term (current) drug therapy: Secondary | ICD-10-CM | POA: Diagnosis not present

## 2023-03-28 DIAGNOSIS — E538 Deficiency of other specified B group vitamins: Secondary | ICD-10-CM | POA: Diagnosis not present

## 2023-03-28 DIAGNOSIS — D509 Iron deficiency anemia, unspecified: Secondary | ICD-10-CM | POA: Diagnosis present

## 2023-03-28 DIAGNOSIS — Z91138 Patient's unintentional underdosing of medication regimen for other reason: Secondary | ICD-10-CM

## 2023-03-28 LAB — CBC WITH DIFFERENTIAL/PLATELET
Abs Immature Granulocytes: 0.01 10*3/uL (ref 0.00–0.07)
Basophils Absolute: 0 10*3/uL (ref 0.0–0.1)
Basophils Relative: 0 %
Eosinophils Absolute: 0 10*3/uL (ref 0.0–0.5)
Eosinophils Relative: 0 %
HCT: 35.7 % — ABNORMAL LOW (ref 39.0–52.0)
Hemoglobin: 11.5 g/dL — ABNORMAL LOW (ref 13.0–17.0)
Immature Granulocytes: 0 %
Lymphocytes Relative: 28 %
Lymphs Abs: 2 10*3/uL (ref 0.7–4.0)
MCH: 24.3 pg — ABNORMAL LOW (ref 26.0–34.0)
MCHC: 32.2 g/dL (ref 30.0–36.0)
MCV: 75.5 fL — ABNORMAL LOW (ref 80.0–100.0)
Monocytes Absolute: 0.4 10*3/uL (ref 0.1–1.0)
Monocytes Relative: 5 %
Neutro Abs: 4.9 10*3/uL (ref 1.7–7.7)
Neutrophils Relative %: 67 %
Platelets: 239 10*3/uL (ref 150–400)
RBC: 4.73 MIL/uL (ref 4.22–5.81)
RDW: 14 % (ref 11.5–15.5)
WBC: 7.4 10*3/uL (ref 4.0–10.5)
nRBC: 0 % (ref 0.0–0.2)

## 2023-03-28 LAB — COMPREHENSIVE METABOLIC PANEL
ALT: 45 U/L — ABNORMAL HIGH (ref 0–44)
AST: 37 U/L (ref 15–41)
Albumin: 4.1 g/dL (ref 3.5–5.0)
Alkaline Phosphatase: 42 U/L (ref 38–126)
Anion gap: 10 (ref 5–15)
BUN: 9 mg/dL (ref 6–20)
CO2: 23 mmol/L (ref 22–32)
Calcium: 8.1 mg/dL — ABNORMAL LOW (ref 8.9–10.3)
Chloride: 107 mmol/L (ref 98–111)
Creatinine, Ser: 0.94 mg/dL (ref 0.61–1.24)
GFR, Estimated: >60 mL/min (ref >60)
Glucose, Bld: 112 mg/dL — ABNORMAL HIGH (ref 70–99)
Potassium: 3.6 mmol/L (ref 3.5–5.1)
Sodium: 140 mmol/L (ref 135–145)
Total Bilirubin: 0.6 mg/dL (ref 0.3–1.2)
Total Protein: 7.5 g/dL (ref 6.5–8.1)

## 2023-03-28 LAB — AMMONIA: Ammonia: 44 umol/L — ABNORMAL HIGH (ref 9–35)

## 2023-03-28 LAB — RAPID URINE DRUG SCREEN, HOSP PERFORMED
Amphetamines: NOT DETECTED
Barbiturates: NOT DETECTED
Benzodiazepines: NOT DETECTED
Cocaine: NOT DETECTED
Opiates: NOT DETECTED
Tetrahydrocannabinol: NOT DETECTED

## 2023-03-28 LAB — URINALYSIS, ROUTINE W REFLEX MICROSCOPIC
Bilirubin Urine: NEGATIVE
Glucose, UA: NEGATIVE mg/dL
Hgb urine dipstick: NEGATIVE
Ketones, ur: NEGATIVE mg/dL
Leukocytes,Ua: NEGATIVE
Nitrite: NEGATIVE
Protein, ur: NEGATIVE mg/dL
Specific Gravity, Urine: 1.01 (ref 1.005–1.030)
pH: 6 (ref 5.0–8.0)

## 2023-03-28 LAB — LACTIC ACID, PLASMA
Lactic Acid, Venous: 2 mmol/L (ref 0.5–1.9)
Lactic Acid, Venous: 2.2 mmol/L (ref 0.5–1.9)

## 2023-03-28 LAB — ETHANOL: Alcohol, Ethyl (B): 423 mg/dL (ref <10)

## 2023-03-28 LAB — CBG MONITORING, ED: Glucose-Capillary: 160 mg/dL — ABNORMAL HIGH (ref 70–99)

## 2023-03-28 MED ORDER — LORAZEPAM 1 MG PO TABS: 1.0000 mg | ORAL_TABLET | ORAL | Status: DC | PRN

## 2023-03-28 MED ORDER — LACTATED RINGERS IV BOLUS
1000.0000 mL | Freq: Once | INTRAVENOUS | Status: AC
  Administered 2023-03-28: 1000 mL via INTRAVENOUS

## 2023-03-28 MED ORDER — THIAMINE HCL 100 MG/ML IJ SOLN
500.0000 mg | Freq: Three times a day (TID) | INTRAVENOUS | Status: AC
  Administered 2023-03-28 – 2023-03-30 (×6): 500 mg via INTRAVENOUS
  Filled 2023-03-28 (×7): qty 5

## 2023-03-28 MED ORDER — THIAMINE MONONITRATE 100 MG PO TABS: 100.0000 mg | ORAL_TABLET | Freq: Every day | ORAL | Status: DC

## 2023-03-28 MED ORDER — CYANOCOBALAMIN 1000 MCG/ML IJ SOLN: 1000.0000 ug | Freq: Every day | INTRAMUSCULAR | Status: DC

## 2023-03-28 MED ORDER — THIAMINE HCL 100 MG/ML IJ SOLN: 250.0000 mg | Freq: Every day | INTRAVENOUS | Status: DC

## 2023-03-28 MED ORDER — HEPARIN SODIUM (PORCINE) 5000 UNIT/ML IJ SOLN
INTRAMUSCULAR | Status: AC
  Filled 2023-03-28: qty 1

## 2023-03-28 MED ORDER — SODIUM CHLORIDE 0.9 % IV SOLN
200.0000 mg | Freq: Once | INTRAVENOUS | Status: AC
  Administered 2023-03-28: 200 mg via INTRAVENOUS
  Filled 2023-03-28: qty 20

## 2023-03-28 MED ORDER — LORAZEPAM 2 MG/ML IJ SOLN
1.0000 mg | INTRAMUSCULAR | Status: DC | PRN
  Administered 2023-03-28: 2 mg via INTRAVENOUS
  Filled 2023-03-28: qty 1

## 2023-03-28 MED ORDER — LEVETIRACETAM IN NACL 1500 MG/100ML IV SOLN
1500.0000 mg | Freq: Once | INTRAVENOUS | Status: AC
  Administered 2023-03-28: 1500 mg via INTRAVENOUS
  Filled 2023-03-28: qty 100

## 2023-03-28 MED ORDER — HEPARIN SODIUM (PORCINE) 5000 UNIT/ML IJ SOLN
5000.0000 [IU] | Freq: Three times a day (TID) | INTRAMUSCULAR | Status: DC
  Administered 2023-03-28 – 2023-03-30 (×6): 5000 [IU] via SUBCUTANEOUS
  Filled 2023-03-28 (×5): qty 1

## 2023-03-28 MED ORDER — FOLIC ACID 1 MG PO TABS
1.0000 mg | ORAL_TABLET | Freq: Every day | ORAL | Status: DC
  Administered 2023-03-29 – 2023-03-30 (×2): 1 mg via ORAL
  Filled 2023-03-28 (×2): qty 1

## 2023-03-28 MED ORDER — KCL-LACTATED RINGERS-D5W 20 MEQ/L IV SOLN
INTRAVENOUS | Status: DC
  Filled 2023-03-28 (×2): qty 1000

## 2023-03-28 MED ORDER — ADULT MULTIVITAMIN W/MINERALS CH
1.0000 | ORAL_TABLET | Freq: Every day | ORAL | Status: DC
  Administered 2023-03-29 – 2023-03-30 (×2): 1 via ORAL
  Filled 2023-03-28 (×2): qty 1

## 2023-03-28 MED ORDER — ACETAMINOPHEN 650 MG RE SUPP: 650.0000 mg | RECTAL | Status: DC | PRN

## 2023-03-28 MED ORDER — ONDANSETRON HCL 4 MG/2ML IJ SOLN
INTRAMUSCULAR | Status: AC
  Administered 2023-03-29: 4 mg via INTRAVENOUS
  Filled 2023-03-28: qty 2

## 2023-03-28 MED ORDER — ONDANSETRON HCL 4 MG/2ML IJ SOLN
4.0000 mg | Freq: Four times a day (QID) | INTRAMUSCULAR | Status: DC | PRN
  Administered 2023-03-28: 4 mg via INTRAVENOUS

## 2023-03-28 MED ORDER — ONDANSETRON HCL 4 MG/2ML IJ SOLN
4.0000 mg | Freq: Once | INTRAMUSCULAR | Status: AC
  Administered 2023-03-28: 4 mg via INTRAVENOUS
  Filled 2023-03-28: qty 2

## 2023-03-28 MED ORDER — THIAMINE HCL 100 MG/ML IJ SOLN: 100.0000 mg | Freq: Every day | INTRAMUSCULAR | Status: DC

## 2023-03-28 MED ORDER — THIAMINE HCL 100 MG/ML IJ SOLN
100.0000 mg | Freq: Once | INTRAMUSCULAR | Status: AC
  Administered 2023-03-28: 100 mg via INTRAVENOUS
  Filled 2023-03-28: qty 2

## 2023-03-28 NOTE — Assessment & Plan Note (Signed)
ETOH level 423 on arrival.

## 2023-03-28 NOTE — ED Notes (Signed)
  Possible seizure activity reported to Dr Jearld Fenton.  Went in to start patients LR bolus to find him snoring and minimally responsive to sternal rub.  SPO2 94% on RA.  Reviewed cardiac monitor and saw prolonged episode of tachycardia with artifact, and hypoxic event to 87%.  Additional medications ordered.  Will continue to monitor.

## 2023-03-28 NOTE — ED Triage Notes (Signed)
Patient brought in by EMS after being found unconscious on the sidewalk. Patient appeared to be post ictal upon EMS arrival. Pt reports that he has not been compliant with his seizure medications.

## 2023-03-28 NOTE — Assessment & Plan Note (Signed)
Admit to [facility] neuro progressive. Please notify on-call neurologist of pt's arrival to [facility]. Keep NPO. D5LR with 20 meq Kcl/l @ 100 ml/hr. Neurology to see patient on arrival and manage pt's AEDs.

## 2023-03-28 NOTE — Assessment & Plan Note (Signed)
Pt non-compliant with AEDs

## 2023-03-28 NOTE — Assessment & Plan Note (Signed)
Chronic. Check TIBC/iron. Iron stores were normal in 07-2022.

## 2023-03-28 NOTE — Assessment & Plan Note (Signed)
Check b12 levels. Start IM b12

## 2023-03-28 NOTE — Subjective & Objective (Addendum)
CC: unresponsive, seizure-suspected HPI: 32 year old African-American male history of alcohol abuse, history of seizure disorder, history of noncompliance who presented to the ER via EMS.  EMS was called out due to the patient being found unresponsive on the sidewalk.  He was able to tell the paramedics that he has been noncompliant with taking his seizure medications.  He smelled heavily of alcohol when EMS first assessed him.  Patient was postictal for EMS providers.  On arrival to the ER, temp 90.4 heart rate 99 blood pressure 124/65.  White count 7.4, hemoglobin 11.5, platelets of 239  Sodium 140, potassium 3.6, bicarb 23, BUN of 9, creatinine 0.94, AST of 37, ALT of 45, alk phos of 42, total bili 0.6  Lactic acid 2.0  Ethyl alcohol of 423  Urine drug screen was negative.  UA is negative.  Ammonia level is 44.  CT head negative for acute intracranial abnormality.  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  Patient had 2 separate seizures in the ER.  He was initially loaded with 1500 mg of Keppra.  After his second seizure, he was given another 1500 rounds of IV Keppra.  Neurology was consulted and 200 mg of IV Vimpat was ordered.  When I saw the patient, he was nearly comatose.  He was protecting his airway.  There was a sandwich and apple juice at his bedside.  Sandwich was half eaten.  There is an n.p.o. order.  [facility] hospitalist contacted for admission.  Due to pt's unresponsive status, pt unable to give HPI or ROS.

## 2023-03-28 NOTE — Assessment & Plan Note (Addendum)
Pt will need to be on CIWA with IV/PO Ativan. Consider Precedex(while continuing IV ativan) if pt has DTs.
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
## 2023-03-28 NOTE — H&P (Signed)
History and Physical    Jose Hernandez:[id number] DOB: [birth year] DOA: 03/28/2023  DOS: the patient was seen and examined on 03/28/2023  PCP: Pcp, No   Patient coming from: Home  I have personally briefly reviewed patient's old medical records in [place] [facility]  CC: unresponsive, seizure-suspected HPI: 32 year old African-American male history of alcohol abuse, history of seizure disorder, history of noncompliance who presented to the ER via EMS.  EMS was called out due to the patient being found unresponsive on the sidewalk.  He was able to tell the paramedics that he has been noncompliant with taking his seizure medications.  He smelled heavily of alcohol when EMS first assessed him.  Patient was postictal for EMS providers.  On arrival to the ER, temp 90.4 heart rate 99 blood pressure 124/65.  White count 7.4, hemoglobin 11.5, platelets of 239  Sodium 140, potassium 3.6, bicarb 23, BUN of 9, creatinine 0.94, AST of 37, ALT of 45, alk phos of 42, total bili 0.6  Lactic acid 2.0  Ethyl alcohol of 423  Urine drug screen was negative.  UA is negative.  Ammonia level is 44.  CT head negative for acute intracranial abnormality.  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  Patient had 2 separate seizures in the ER.  He was initially loaded with 1500 mg of Keppra.  After his second seizure, he was given another 1500 rounds of IV Keppra.  Neurology was consulted and 200 mg of IV Vimpat was ordered.  When I saw the patient, he was nearly comatose.  He was protecting his airway.  There was a sandwich and apple juice at his bedside.  Sandwich was half eaten.  There is an n.p.o. order.  [facility] contacted for admission.  Due to pt's unresponsive status, pt unable to give HPI or ROS.   ED Course: 2 separate seizures in ER. CT head negative. Given Keppra 1500 mg IV X 2 doses and IV Vimpat  Review of Systems:  Review of Systems  Unable to  perform ROS: Patient unresponsive    Past Medical History:  Diagnosis Date   Abscess    Anemia    History of epistaxis    years ago   Subdural hemorrhage (HCC) 07/19/2022    Past Surgical History:  Procedure Laterality Date   INCISION AND DRAINAGE PERIRECTAL ABSCESS N/A 03/07/2016   Procedure: IRRIGATION AND DEBRIDEMENT PERIRECTAL ABSCESS;  Surgeon: Manus Rudd, MD;  Location: [facility] OR;  Service: General;  Laterality: N/A;   IR ANGIO EXTERNAL CAROTID SEL EXT CAROTID UNI L MOD SED  07/21/2022   IR ANGIO INTRA EXTRACRAN SEL INTERNAL CAROTID UNI L MOD SED  07/21/2022   IR ANGIOGRAM FOLLOW UP STUDY  07/21/2022   IR NEURO EACH ADD'L AFTER BASIC UNI LEFT (MS)  07/21/2022   IR TRANSCATH/EMBOLIZ  07/21/2022   RADIOLOGY WITH ANESTHESIA N/A 07/21/2022   Procedure: SAH embolization;  Surgeon: Lisbeth Renshaw, MD;  Location: [facility] OR;  Service: Radiology;  Laterality: N/A;     reports that he has been smoking cigarettes. He has never used smokeless tobacco. He reports current alcohol use of about 3.0 standard drinks of alcohol per week. He reports that he does not use drugs.  No Known Allergies  History reviewed. No pertinent family history.  Prior to Admission medications   Medication Sig Start Date End Date Taking? Authorizing Provider  levETIRAcetam (KEPPRA) 500 MG tablet Take 1 tablet (500 mg total) by mouth 2 (two) times  daily. 12/24/22   Jacalyn Lefevre, MD    Physical Exam: Vitals:   03/28/23 2015 03/28/23 2030 03/28/23 2100 03/28/23 2115  BP: (!) 93/38 (!) 98/44 110/66 102/73  Pulse: 83 80 78 71  Resp: (!) 23 (!) 22 (!) 22 20  Temp:      TempSrc:      SpO2: 94% 94% 98% 96%  Weight:      Height:        Physical Exam Vitals and nursing note reviewed.  Constitutional:      Comments: Unresponsive to painful stimuli(sternal rub, nailbed pressure)  HENT:     Head: Normocephalic.  Eyes:     Conjunctiva/sclera:     Right eye: Right conjunctiva is injected.     Left eye: Left conjunctiva  is injected.  Cardiovascular:     Rate and Rhythm: Normal rate and regular rhythm.  Pulmonary:     Effort: Pulmonary effort is normal. No respiratory distress.     Comments: Protecting his airway Abdominal:     General: Bowel sounds are normal. There is no distension.     Palpations: Abdomen is soft.  Musculoskeletal:     Right lower leg: No edema.     Left lower leg: No edema.  Skin:    General: Skin is warm and dry.  Neurological:     Comments: Unresponsive to painful stimuli      Labs on Admission: I have personally reviewed following labs and imaging studies  CBC: Recent Labs  Lab 03/28/23 1637  WBC 7.4  NEUTROABS 4.9  HGB 11.5*  HCT 35.7*  MCV 75.5*  PLT 239   Basic Metabolic Panel: Recent Labs  Lab 03/28/23 1637  NA 140  K 3.6  CL 107  CO2 23  GLUCOSE 112*  BUN 9  CREATININE 0.94  CALCIUM 8.1*   GFR: Estimated Creatinine Clearance: 111.8 mL/min (by C-G formula based on SCr of 0.94 mg/dL). Liver Function Tests: Recent Labs  Lab 03/28/23 1637  AST 37  ALT 45*  ALKPHOS 42  BILITOT 0.6  PROT 7.5  ALBUMIN 4.1    Recent Labs  Lab 03/28/23 2120  AMMONIA 44*   CBG: Recent Labs  Lab 03/28/23 1720  GLUCAP 160*   Urine Drug Screen: Lab Results  Component Value Date/Time   LABOPIA NONE DETECTED 03/28/2023 04:22 PM   COCAINSCRNUR NONE DETECTED 03/28/2023 04:22 PM   LABBENZ NONE DETECTED 03/28/2023 04:22 PM   AMPHETMU NONE DETECTED 03/28/2023 04:22 PM   THCU NONE DETECTED 03/28/2023 04:22 PM   LABBARB NONE DETECTED 03/28/2023 04:22 PM     Urine analysis:    Component Value Date/Time   COLORURINE YELLOW 03/28/2023 1622   APPEARANCEUR CLEAR 03/28/2023 1622   LABSPEC 1.010 03/28/2023 1622   PHURINE 6.0 03/28/2023 1622   GLUCOSEU NEGATIVE 03/28/2023 1622   HGBUR NEGATIVE 03/28/2023 1622   BILIRUBINUR NEGATIVE 03/28/2023 1622   KETONESUR NEGATIVE 03/28/2023 1622   PROTEINUR NEGATIVE 03/28/2023 1622   NITRITE NEGATIVE 03/28/2023 1622    LEUKOCYTESUR NEGATIVE 03/28/2023 1622    Radiological Exams on Admission: I have personally reviewed images CT Head Wo Contrast  Result Date: 03/28/2023 CLINICAL DATA:  Found unconscious on sidewalk. EXAM: CT HEAD WITHOUT CONTRAST TECHNIQUE: Contiguous axial images were obtained from the base of the skull through the vertex without intravenous contrast. RADIATION DOSE REDUCTION: This exam was performed according to the departmental dose-optimization program which includes automated exposure control, adjustment of the mA and/or kV according to patient size and/or  use of iterative reconstruction technique. COMPARISON:  CT head 02/16/2023 FINDINGS: Brain: No intracranial hemorrhage, mass effect, or evidence of acute infarct. No hydrocephalus. No extra-axial fluid collection. Vascular: No hyperdense vessel or unexpected calcification. 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 Skull: No fracture or focal lesion. Sinuses/Orbits: No acute finding. Other: None. IMPRESSION: 1. No acute intracranial abnormality. Electronically Signed   By: Minerva Fester M.D.   On: 03/28/2023 17:10    EKG: My personal interpretation of EKG shows: NSR    Assessment/Plan Principal Problem:   Status epilepticus (HCC) Active Problems:   Seizure (HCC)   Alcohol abuse   Microcytic anemia   Alcohol intoxication (HCC)   B12 deficiency    Assessment and Plan: * Status epilepticus (HCC) Admit to [facility] neuro progressive. Please notify on-call neurologist of pt's arrival to [facility]. Keep NPO. D5LR with 20 meq Kcl/l @ 100 ml/hr. Neurology to see patient on arrival and manage pt's AEDs.  B12 deficiency Check b12 levels. Start IM b12  Alcohol intoxication (HCC) ETOH level 423 on arrival.  Microcytic anemia Chronic. Check TIBC/iron. Iron stores were normal in 07-2022.  Alcohol abuse Pt will need to be on CIWA with IV/PO Ativan. Consider Precedex(while continuing IV ativan) if pt has DTs.  Seizure (HCC) Pt non-compliant  with AEDs   DVT prophylaxis: SQ Heparin Code Status: Full Code by default. No family at bedside Family Communication: no family at bedside  Disposition Plan: return home  Consults called: neurology  Admission status: Observation,  neuro progressive   Carollee Herter, DO [facility] 03/28/2023, 10:15 PM

## 2023-03-29 ENCOUNTER — Observation Stay (HOSPITAL_COMMUNITY): Payer: Medicaid Other

## 2023-03-29 DIAGNOSIS — Z91138 Patient's unintentional underdosing of medication regimen for other reason: Secondary | ICD-10-CM | POA: Diagnosis not present

## 2023-03-29 DIAGNOSIS — G40901 Epilepsy, unspecified, not intractable, with status epilepticus: Secondary | ICD-10-CM | POA: Diagnosis present

## 2023-03-29 DIAGNOSIS — R569 Unspecified convulsions: Secondary | ICD-10-CM | POA: Diagnosis present

## 2023-03-29 DIAGNOSIS — D509 Iron deficiency anemia, unspecified: Secondary | ICD-10-CM | POA: Diagnosis present

## 2023-03-29 DIAGNOSIS — Z87891 Personal history of nicotine dependence: Secondary | ICD-10-CM | POA: Diagnosis not present

## 2023-03-29 DIAGNOSIS — T426X6A Underdosing of other antiepileptic and sedative-hypnotic drugs, initial encounter: Secondary | ICD-10-CM | POA: Diagnosis present

## 2023-03-29 DIAGNOSIS — F10129 Alcohol abuse with intoxication, unspecified: Secondary | ICD-10-CM | POA: Diagnosis present

## 2023-03-29 DIAGNOSIS — Y908 Blood alcohol level of 240 mg/100 ml or more: Secondary | ICD-10-CM | POA: Diagnosis present

## 2023-03-29 DIAGNOSIS — E538 Deficiency of other specified B group vitamins: Secondary | ICD-10-CM | POA: Diagnosis present

## 2023-03-29 DIAGNOSIS — Z79899 Other long term (current) drug therapy: Secondary | ICD-10-CM | POA: Diagnosis not present

## 2023-03-29 DIAGNOSIS — R0902 Hypoxemia: Secondary | ICD-10-CM | POA: Diagnosis present

## 2023-03-29 LAB — COMPREHENSIVE METABOLIC PANEL
ALT: 41 U/L (ref 0–44)
AST: 36 U/L (ref 15–41)
Albumin: 3.3 g/dL — ABNORMAL LOW (ref 3.5–5.0)
Alkaline Phosphatase: 33 U/L — ABNORMAL LOW (ref 38–126)
Anion gap: 7 (ref 5–15)
BUN: 8 mg/dL (ref 6–20)
CO2: 27 mmol/L (ref 22–32)
Calcium: 8.2 mg/dL — ABNORMAL LOW (ref 8.9–10.3)
Chloride: 107 mmol/L (ref 98–111)
Creatinine, Ser: 0.93 mg/dL (ref 0.61–1.24)
GFR, Estimated: >60 mL/min (ref >60)
Glucose, Bld: 96 mg/dL (ref 70–99)
Potassium: 3.8 mmol/L (ref 3.5–5.1)
Sodium: 141 mmol/L (ref 135–145)
Total Bilirubin: 0.7 mg/dL (ref 0.3–1.2)
Total Protein: 6.1 g/dL — ABNORMAL LOW (ref 6.5–8.1)

## 2023-03-29 LAB — CBC WITH DIFFERENTIAL/PLATELET
Abs Immature Granulocytes: 0.02 10*3/uL (ref 0.00–0.07)
Basophils Absolute: 0 10*3/uL (ref 0.0–0.1)
Basophils Relative: 0 %
Eosinophils Absolute: 0.1 10*3/uL (ref 0.0–0.5)
Eosinophils Relative: 1 %
HCT: 33.7 % — ABNORMAL LOW (ref 39.0–52.0)
Hemoglobin: 11.1 g/dL — ABNORMAL LOW (ref 13.0–17.0)
Immature Granulocytes: 0 %
Lymphocytes Relative: 31 %
Lymphs Abs: 2.4 10*3/uL (ref 0.7–4.0)
MCH: 24.8 pg — ABNORMAL LOW (ref 26.0–34.0)
MCHC: 32.9 g/dL (ref 30.0–36.0)
MCV: 75.4 fL — ABNORMAL LOW (ref 80.0–100.0)
Monocytes Absolute: 0.5 10*3/uL (ref 0.1–1.0)
Monocytes Relative: 6 %
Neutro Abs: 4.7 10*3/uL (ref 1.7–7.7)
Neutrophils Relative %: 62 %
Platelets: 207 10*3/uL (ref 150–400)
RBC: 4.47 MIL/uL (ref 4.22–5.81)
RDW: 14.6 % (ref 11.5–15.5)
WBC: 7.8 10*3/uL (ref 4.0–10.5)
nRBC: 0 % (ref 0.0–0.2)

## 2023-03-29 LAB — IRON AND TIBC
Iron: 162 ug/dL (ref 45–182)
Saturation Ratios: 50 % — ABNORMAL HIGH (ref 17.9–39.5)
TIBC: 326 ug/dL (ref 250–450)
UIBC: 164 ug/dL

## 2023-03-29 LAB — MAGNESIUM: Magnesium: 1.8 mg/dL (ref 1.7–2.4)

## 2023-03-29 LAB — VITAMIN B12: Vitamin B-12: 177 pg/mL — ABNORMAL LOW (ref 180–914)

## 2023-03-29 MED ORDER — LEVETIRACETAM 500 MG PO TABS
500.0000 mg | ORAL_TABLET | Freq: Two times a day (BID) | ORAL | Status: DC
  Administered 2023-03-29 – 2023-03-30 (×3): 500 mg via ORAL
  Filled 2023-03-29 (×3): qty 1

## 2023-03-29 MED ORDER — ACETAMINOPHEN 500 MG PO TABS
500.0000 mg | ORAL_TABLET | Freq: Four times a day (QID) | ORAL | Status: DC | PRN
  Administered 2023-03-29 – 2023-03-30 (×2): 500 mg via ORAL
  Filled 2023-03-29 (×2): qty 1

## 2023-03-29 MED ORDER — LEVETIRACETAM IN NACL 500 MG/100ML IV SOLN
500.0000 mg | Freq: Two times a day (BID) | INTRAVENOUS | Status: DC
  Administered 2023-03-29: 500 mg via INTRAVENOUS
  Filled 2023-03-29 (×2): qty 100

## 2023-03-29 MED ORDER — CLONIDINE HCL 0.1 MG PO TABS
0.1000 mg | ORAL_TABLET | Freq: Three times a day (TID) | ORAL | Status: DC
  Administered 2023-03-29 – 2023-03-30 (×3): 0.1 mg via ORAL
  Filled 2023-03-29 (×3): qty 1

## 2023-03-29 MED ORDER — METOPROLOL TARTRATE 5 MG/5ML IV SOLN
5.0000 mg | Freq: Once | INTRAVENOUS | Status: AC
  Administered 2023-03-29: 5 mg via INTRAVENOUS
  Filled 2023-03-29: qty 5

## 2023-03-29 MED ORDER — CYANOCOBALAMIN 1000 MCG/ML IJ SOLN
1000.0000 ug | Freq: Every day | INTRAMUSCULAR | Status: DC
  Administered 2023-03-29 – 2023-03-30 (×2): 1000 ug via SUBCUTANEOUS
  Filled 2023-03-29 (×2): qty 1

## 2023-03-29 MED ORDER — POTASSIUM CHLORIDE IN NACL 20-0.9 MEQ/L-% IV SOLN
INTRAVENOUS | Status: DC
  Filled 2023-03-29 (×2): qty 1000

## 2023-03-29 MED ORDER — CLONIDINE HCL 0.1 MG PO TABS: 0.1000 mg | ORAL_TABLET | Freq: Three times a day (TID) | ORAL | Status: DC

## 2023-03-29 MED ORDER — ACETAMINOPHEN 325 MG PO TABS: 650.0000 mg | ORAL_TABLET | Freq: Four times a day (QID) | ORAL | Status: DC | PRN
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
## 2023-03-29 NOTE — Procedures (Addendum)
Patient Name: Jose Hernandez  MRN: [id number]  Epilepsy Attending: Charlsie Quest  Referring Physician/Provider: Erick Blinks, MD  Duration: 03/29/2023 0306 to 03/29/2023 1217  Patient history: 32 y.o. male with PMH significant for history of subdural hematoma, seizure disorder, alcohol use, unhomed. His neurologic examination is notable for some lethargy but overall awake and alert and neurologically intact.  History appears consistent with breakthrough seizures secondary to medication nonadherence. EEG to evaluate for seizure  Level of alertness: Awake, asleep  AEDs during EEG study: LEV, Ativan  Technical aspects: This EEG study was done with scalp electrodes positioned according to the 10-20 International system of electrode placement. Electrical activity was reviewed with band pass filter of 1-70Hz , sensitivity of 7 uV/mm, display speed of 66mm/sec with a 60Hz  notched filter applied as appropriate. EEG data were recorded continuously and digitally stored.  Video monitoring was available and reviewed as appropriate.  Description: The posterior dominant rhythm consists of 9 Hz activity of moderate voltage (25-35 uV) seen predominantly in posterior head regions, symmetric and reactive to eye opening and eye closing. Sleep was characterized by vertex waves, sleep spindles (12 to 14 Hz), maximal frontocentral region. Hyperventilation and photic stimulation were not performed.     IMPRESSION: This study is within normal limits. No seizures or epileptiform discharges were seen throughout the recording.  A normal interictal EEG does not exclude the diagnosis of epilepsy.  Olman Yono Annabelle Harman

## 2023-03-29 NOTE — Progress Notes (Signed)
MEWS Progress Note  Patient Details Name: Jose Hernandez MRN: [id number] DOB: [birth year] Today's Date: 03/29/2023   MEWS Flowsheet Documentation:  Assess: MEWS Score Temp: 98.2 F (36.8 C) BP: 130/73 MAP (mmHg): 87 Pulse Rate: (!) 114 ECG Heart Rate: (!) 115 Resp: 19 Level of Consciousness: Alert SpO2: 99 % O2 Device: Room Air Assess: MEWS Score MEWS Temp: 0 MEWS Systolic: 0 MEWS Pulse: 2 MEWS RR: 0 MEWS LOC: 0 MEWS Score: 2 MEWS Score Color: Yellow Assess: SIRS CRITERIA SIRS Temperature : 0 SIRS Respirations : 0 SIRS Pulse: 1 SIRS WBC: 0 SIRS Score Sum : 1 Assess: if the MEWS score is Yellow or Red Were vital signs taken at a resting state?: Yes Focused Assessment: No change from prior assessment Does the patient meet 2 or more of the SIRS criteria?: No MEWS guidelines implemented : No, other (Comment) (Notified to Dr. Tinnie Gens about patient's HR going above 110) Notify: Charge Nurse/RN Name of Charge Nurse/RN Notified: French Ana, RN Provider Notification Provider Name/Title: Jetty Duhamel MD Date Provider Notified: 03/29/23 Time Provider Notified: 1120 Method of Notification: Page Notification Reason: Other (Comment) (HR going up [place] 110) Test performed and critical result: ETOH 423 Lactic 2.0 Date Critical Result Received: 03/28/23 Time Critical Result Received: 1802 Provider response: In department Date of Provider Response: 03/29/23 Time of Provider Response: 1122      [name] 03/29/2023, 11:36 AM

## 2023-03-29 NOTE — Progress Notes (Signed)
Jose Hernandez  ZOX:[id number] DOB: 07/26/1991 DOA: 03/28/2023 PCP: Pcp, No    Brief Narrative:  32 year old with a history of alcohol abuse, prior SDH, seizure disorder, and medical noncompliance who was brought to the ER by EMS 03/28/2023 after he was found unresponsive on the sidewalk.  He admitted to the paramedics that he had been noncompliant with his seizure medications.  He reportedly smelled heavily of alcohol.  Upon arrival to the ER his vital signs were stable but he was hypothermic with a temperature of 90.4.  Ethyl alcohol level was 423.  UDS was negative.  CT head was negative for acute findings.  While in the ER the patient suffered 2 seizures.  He was loaded with Keppra x 2 as well as Vimpat.  Consultants:  Neurology  Goals of Care:  Code Status: Full Code   DVT prophylaxis: Subcutaneous heparin  Interim Hx: No acute events reported since admission.  The patient has awoken this morning and is ready for diet.  He is afebrile and vital signs are stable.  He is suffering intermittent episodes of tachycardia but appears completely stable during these episodes.  He denies tremulousness agitation abdominal pain nausea vomiting or chest pain.  He reports some mild generalized headache.  Assessment & Plan:  Seizure -seizure disorder - medication noncompliance Care per neurology team -rapidly improving clinically -admits to noncompliance with antiepileptic drugs -no ongoing seizure activity on long-term EEG  B12 deficiency B12 is markedly depressed at 177 -supplement with subcutaneous load during admission then transition to oral supplementation at discharge with need for follow-up level in 6-8 weeks  Sinus tachycardia Patient appears clinically stable during these episodes -perhaps simply related to dehydration and clinical situation -monitor closely -short trial of clonidine in setting of possible early alcohol withdrawal  Alcohol intoxication -alcohol abuse Ethanol level 423  on arrival -monitor for withdrawal over next 72 hours  Microcytic anemia Iron and TIBC are actually normal/high normal -this may simply be due to the toxic effects of high-dose ongoing alcohol on the bone marrow  Possible suicidal ideation  Patient reportedly informed the ER physician at time of his presentation that he attempted to hang himself 3 days ago and also considered walking out in front of a car -notes suggest she was able to contract for safety at that time -will discuss with him further during his hospital stay   Family Communication: No family present at time of exam Disposition: Will depend upon psychiatric state during this hospitalization - from an independent home    Objective: Blood pressure (!) 105/92, pulse 72, temperature 97.7 F (36.5 C), temperature source Oral, resp. rate 18, height 5\' 9"  (1.753 m), weight 81.2 kg, SpO2 97 %.  Intake/Output Summary (Last 24 hours) at 03/29/2023 0806 Last data filed at 03/29/2023 0542 Gross per 24 hour  Intake 554.13 ml  Output 1150 ml  Net -595.87 ml   Filed Weights   03/28/23 1612 03/29/23 0500  Weight: 77 kg 81.2 kg    Examination: General: No acute respiratory distress Lungs: Clear to auscultation bilaterally without wheezes or crackles Cardiovascular: Regular rate and rhythm without murmur gallop or rub normal S1 and S2 Abdomen: Nontender, nondistended, soft, bowel sounds positive, no rebound, no ascites, no appreciable mass Extremities: No significant cyanosis, clubbing, or edema bilateral lower extremities  CBC: Recent Labs  Lab 03/28/23 1637 03/29/23 0253  WBC 7.4 7.8  NEUTROABS 4.9 4.7  HGB 11.5* 11.1*  HCT 35.7* 33.7*  MCV 75.5* 75.4*  PLT  239 207   Basic Metabolic Panel: Recent Labs  Lab 03/28/23 1637 03/29/23 0253  NA 140 141  K 3.6 3.8  CL 107 107  CO2 23 27  GLUCOSE 112* 96  BUN 9 8  CREATININE 0.94 0.93  CALCIUM 8.1* 8.2*  MG  --  1.8   GFR: Estimated Creatinine Clearance: 113 mL/min  (by C-G formula based on SCr of 0.93 mg/dL).   Scheduled Meds:  cyanocobalamin  1,000 mcg Intramuscular Daily   folic acid  1 mg Oral Daily   heparin       heparin  5,000 Units Subcutaneous Q8H   levETIRAcetam  500 mg Oral BID   multivitamin with minerals  1 tablet Oral Daily   [START ON 04/06/2023] thiamine (VITAMIN B1) injection  100 mg Intravenous Daily   Continuous Infusions:  dextrose 5% lactated ringers with KCl 20 mEq/L 100 mL/hr at 03/29/23 0340   levETIRAcetam Stopped (03/29/23 0153)   thiamine (VITAMIN B1) injection 500 mg (03/29/23 0620)   Followed by   Melene Muller ON 03/31/2023] thiamine (VITAMIN B1) injection       LOS: 0 days   Lonia Blood, MD [facility] Office  [phone] Pager - Text Page per Amion  If 7PM-7AM, please contact night-coverage per Amion 03/29/2023, 8:06 AM

## 2023-03-29 NOTE — Plan of Care (Signed)
  Problem: Education: Goal: Knowledge of General Education information will improve Description: Including pain rating scale, medication(s)/side effects and non-pharmacologic comfort measures Outcome: Progressing   Problem: Clinical Measurements: Goal: Respiratory complications will improve Outcome: Progressing Goal: Cardiovascular complication will be avoided Outcome: Progressing   Problem: Activity: Goal: Risk for activity intolerance will decrease Outcome: Progressing   Problem: Nutrition: Goal: Adequate nutrition will be maintained Outcome: Progressing   Problem: Coping: Goal: Level of anxiety will decrease Outcome: Progressing   Problem: Pain Managment: Goal: General experience of comfort will improve Outcome: Progressing   

## 2023-03-29 NOTE — TOC Initial Note (Signed)
Transition of Care [facility]) - Initial/Assessment Note    Patient Details  Name: Jose Hernandez MRN: [id number] Date of Birth: [birth year]  [facility]) CM/SW Contact:    Kermit Balo, RN Phone Number: 03/29/2023, 2:26 PM  Clinical Narrative:                 CM met with the patient and his girlfriend at the bedside. They states pt lives with her and her children. Girlfriend works nights but home in daytime. No DME.  Cm inquired about him not taking his medications as prescribed and he states he ran out and forgot to get more. CM has discussed with him and girlfriend the importance of his medications and that compliance will help prevent these hospital stays. Pt voiced understanding.  No PCP. CM was able to get him an appointment with [facility] and appt on AVS. Girlfriend states she is able to provide needed transportation as pt can not drive for 6 months. TOC following.   Expected Discharge Plan: Home/Self Care Barriers to Discharge: Continued Medical Work up   Patient Goals and CMS Choice            Expected Discharge Plan and Services   Discharge Planning Services: CM Consult   Living arrangements for the past 2 months: Single Family Home                                      Prior Living Arrangements/Services Living arrangements for the past 2 months: Single Family Home Lives with:: Significant Other Patient language and need for interpreter reviewed:: Yes Do you feel safe going back to the place where you live?: Yes            Criminal Activity/Legal Involvement Pertinent to Current Situation/Hospitalization: No - Comment as needed  Activities of Daily Living Home Assistive Devices/Equipment: None ADL Screening (condition at time of admission) Patient's cognitive ability adequate to safely complete daily activities?: No Is the patient deaf or have difficulty hearing?: No Does the patient have difficulty seeing, even when wearing  glasses/contacts?: No Does the patient have difficulty concentrating, remembering, or making decisions?: Yes Patient able to express need for assistance with ADLs?: Yes Does the patient have difficulty dressing or bathing?: Yes Independently performs ADLs?: No Communication: Independent Dressing (OT): Needs assistance Is this a change from baseline?: Change from baseline, expected to last <3days Grooming: Needs assistance Is this a change from baseline?: Change from baseline, expected to last <3 days Feeding: Needs assistance Is this a change from baseline?: Change from baseline, expected to last <3 days Bathing: Needs assistance Is this a change from baseline?: Change from baseline, expected to last <3 days Toileting: Needs assistance Is this a change from baseline?: Change from baseline, expected to last <3 days In/Out Bed: Needs assistance Is this a change from baseline?: Change from baseline, expected to last <3 days Walks in Home: Independent Does the patient have difficulty walking or climbing stairs?: No Weakness of Legs: Both Weakness of Arms/Hands: Both  Permission Sought/Granted                  Emotional Assessment Appearance:: Appears stated age Attitude/Demeanor/Rapport: Engaged Affect (typically observed): Accepting Orientation: : Oriented to Self, Oriented to Place, Oriented to  Time, Oriented to Situation Alcohol / Substance Use: Alcohol Use Psych Involvement: No (comment)  Admission diagnosis:  Status epilepticus (HCC) [  G40.901] Alcoholic intoxication with complication (HCC) [F10.929] Seizure [facility]) [R56.9] Patient Active Problem List   Diagnosis Date Noted   Status epilepticus (HCC) 03/28/2023   Alcohol intoxication (HCC) 03/28/2023   B12 deficiency 03/28/2023   Seizure (HCC) 07/19/2022   Alcohol abuse 07/19/2022   Microcytic anemia 07/19/2022   EPISTAXIS, RECURRENT 04/12/2009   RHINITIS, ALLERGIC 01/17/2007   PCP:  Oneita Hurt, No Pharmacy:   [facility]  [id number] [place], [place] - [address] AT [facility] & [address] [place] [address] Phone: [phone] Fax: [phone]  [facility] - [facility] [address] [place] [address] Phone: [phone] Fax: [phone]  [id number] [place], [place] - [address] AT [facility] OF [address] & [facility] [address] [place] [address] Phone: [phone] Fax: [phone]     Social Determinants of Health (SDOH) Social History: SDOH Screenings   Food Insecurity: No Food Insecurity (03/29/2023)  Housing: Low Risk  (03/29/2023)  Transportation Needs: No Transportation Needs (03/29/2023)  Utilities: Not At Risk (03/29/2023)  Tobacco Use: High Risk (03/28/2023)   SDOH Interventions:     Readmission Risk Interventions     No data to display

## 2023-03-29 NOTE — Consult Note (Signed)
NEUROLOGY CONSULTATION NOTE   Date of service: Mar 29, 2023 Patient Name: Jose Hernandez MRN:  [id number] DOB:  March 19, 1991 Reason for consult: "seizures, obtunded mentation" Requesting Provider: Carollee Herter, DO _ _ _   _ __   _ __ _ _  __ __   _ __   __ _  History of Present Illness  Jose Hernandez is a 32 y.o. male with PMH significant for prior SDH, seizures and noncompliant with keppra, EtOH use who was brought in to [facility] by EMS. He was found unresponsive on the side walk and concern that he had seizure.  He was brought in to the ED where EtOH levels were over 400. CT Head negative for ICH or any significant abnormality.  He had 2 seizures in the ED and was post ictal but able to briefly follow commands in the ED. He was loaded with Keppra and plan was to admit to hospital for observation overnight for seizure clustering. He was evaluated by hospitalist team and noted to be obtunded and discussed with me and transferred to [facility] for concern for potential subclinical seizure.  He is still significantly post ictal but with noxious stimuli and vigorous tactile stimuli, will briefly open his eyes, oriented to self and follows 1 step commands before going back to sleep.    ROS   Unable to obtain 2/2 post ictal somnolence.  Past History   Past Medical History:  Diagnosis Date   Abscess    Anemia    History of epistaxis    years ago   Subdural hemorrhage (HCC) 07/19/2022   Past Surgical History:  Procedure Laterality Date   INCISION AND DRAINAGE PERIRECTAL ABSCESS N/A 03/07/2016   Procedure: IRRIGATION AND DEBRIDEMENT PERIRECTAL ABSCESS;  Surgeon: Manus Rudd, MD;  Location: [facility] OR;  Service: General;  Laterality: N/A;   IR ANGIO EXTERNAL CAROTID SEL EXT CAROTID UNI L MOD SED  07/21/2022   IR ANGIO INTRA EXTRACRAN SEL INTERNAL CAROTID UNI L MOD SED  07/21/2022   IR ANGIOGRAM FOLLOW UP STUDY  07/21/2022   IR NEURO EACH ADD'L AFTER BASIC UNI LEFT (MS)  07/21/2022   IR  TRANSCATH/EMBOLIZ  07/21/2022   RADIOLOGY WITH ANESTHESIA N/A 07/21/2022   Procedure: SAH embolization;  Surgeon: Lisbeth Renshaw, MD;  Location: [facility] OR;  Service: Radiology;  Laterality: N/A;   History reviewed. No pertinent family history. Social History   Socioeconomic History   Marital status: Single    Spouse name: Not on file   Number of children: Not on file   Years of education: Not on file   Highest education level: Not on file  Occupational History   Not on file  Tobacco Use   Smoking status: Some Days    Types: Cigarettes   Smokeless tobacco: Never  Vaping Use   Vaping Use: Never used  Substance and Sexual Activity   Alcohol use: Yes    Alcohol/week: 3.0 standard drinks of alcohol    Types: 3 Cans of beer per week    Comment: 4 times a week   Drug use: No   Sexual activity: Not on file  Other Topics Concern   Not on file  Social History Narrative   Not on file   Social Determinants of Health   Financial Resource Strain: Not on file  Food Insecurity: No Food Insecurity (03/29/2023)   Hunger Vital Sign    Worried About Running Out of Food in the Last Year: Never true  Ran Out of Food in the Last Year: Never true  Transportation Needs: No Transportation Needs (03/29/2023)   PRAPARE - Administrator, Civil Service (Medical): No    Lack of Transportation (Non-Medical): No  Physical Activity: Not on file  Stress: Not on file  Social Connections: Not on file   No Known Allergies  Medications   Medications Prior to Admission  Medication Sig Dispense Refill Last Dose   levETIRAcetam (KEPPRA) 500 MG tablet Take 1 tablet (500 mg total) by mouth 2 (two) times daily. 60 tablet 1      Vitals   Vitals:   03/28/23 2100 03/28/23 2115 03/28/23 2145 03/28/23 2342  BP: 110/66 102/73 98/62   Pulse: 78 71 71   Resp: (!) 22 20 20    Temp:   98 F (36.7 C) 97.6 F (36.4 C)  TempSrc:   Oral Oral  SpO2: 98% 96% 94%   Weight:      Height:         Body  mass index is 25.07 kg/m.  Physical Exam   General: Laying comfortably in bed; in no acute distress.  HENT: Normal oropharynx and mucosa. Normal external appearance of ears and nose.  Neck: Supple, no pain or tenderness  CV: No JVD. No peripheral edema.  Pulmonary: Symmetric Chest rise. Normal respiratory effort.  Abdomen: Soft to touch, non-tender.  Ext: No cyanosis, edema, or deformity  Skin: No rash. Normal palpation of skin.   Musculoskeletal: Normal digits and nails by inspection. No clubbing.   Neurologic Examination  Mental status/Cognition: eyes closed, opens eyes briefly and partially to vigorous tactile stimulation. Oriented to self. Does not answer any more orientation questions. Speech/language: slurred, limited evaluation due to somnolence. Cranial nerves:   CN II Pupils equal and reactive to light, unable to assess for VF deficits.   CN III,IV,VI EOMI intact to dolls eyes.   CN V Corneals intact BL   CN VII Symmetric facial grimace to noxious stimuli   CN VIII normal hearing to speech   CN IX & X Protecting his airway.   CN XI Head midline.   CN XII midline tongue protrusion    Motor:  Muscle bulk: normal, tone normal Gives thumbs up on command BL Wiggles toes to command BL  Sensation:  Light touch    Pin prick Localizes to pinch in all extremities.   Temperature    Vibration   Proprioception    Coordination/Complex Motor:  - Unable to assess. - Gait: deferred for patient safety.  Labs   CBC:  Recent Labs  Lab 03/28/23 1637  WBC 7.4  NEUTROABS 4.9  HGB 11.5*  HCT 35.7*  MCV 75.5*  PLT 239    Basic Metabolic Panel:  Lab Results  Component Value Date   NA 140 03/28/2023   K 3.6 03/28/2023   CO2 23 03/28/2023   GLUCOSE 112 (H) 03/28/2023   BUN 9 03/28/2023   CREATININE 0.94 03/28/2023   CALCIUM 8.1 (L) 03/28/2023   GFRNONAA >60 03/28/2023   GFRAA >60 12/25/2017   Lipid Panel:  Lab Results  Component Value Date   LDLCALC 49  02/20/2014   HgbA1c: No results found for: "HGBA1C" Urine Drug Screen:     Component Value Date/Time   LABOPIA NONE DETECTED 03/28/2023 1622   COCAINSCRNUR NONE DETECTED 03/28/2023 1622   LABBENZ NONE DETECTED 03/28/2023 1622   AMPHETMU NONE DETECTED 03/28/2023 1622   THCU NONE DETECTED 03/28/2023 1622   LABBARB NONE DETECTED  03/28/2023 1622    Alcohol Level     Component Value Date/Time   ETH [facility]) 03/28/2023 1637    CT Head without contrast(Personally reviewed): CTH was negative for a large hypodensity concerning for a large territory infarct or hyperdensity concerning for an ICH  LTM EEG: pending  Impression   Tavone L Wick is a 32 y.o. male with PMH significant for prior SDH, seizures and noncompliant with keppra, EtOH use who was brought in to [facility] by EMS. He was found unresponsive on the side walk and concern that he had seizure. He had 2 additional seizures in the ED. He was loaded with Keppra and then proceeded to have a second seizure. He was loaded with Vimpat and Keppra was resumed at home dose.  HE was post ictal and admitted for obs overnight to monitor for seizure clustering. However, concern for potential subclinical seizures with obtunded mentation and thus transferred to [facility] for neurology evaluation and cEEG.  With vigorous tactile stimulation, he is able to follow commands in all extremities and oriented to self. My concern for him actively seizing is low at this time.  Recommendations  - overnight LTM EEG - Keppra 500mg  BID - ammonia - thiamine high dose replacement - Ativan 1-2 mg for seizure lasting more than 3 mins. - we will continue to follow. - when he is more awake, will need to reiterate that he can't drive for 6 months and has to be seizure free before he can resume driving. ______________________________________________________________________   Thank you for the opportunity to take part in the care of this patient. If you have any  further questions, please contact the neurology consultation attending.  Signed,  Erick Blinks [facility] Pager Number [phone] _ _ _   _ __   _ __ _ _  __ __   _ __   __ _

## 2023-03-29 NOTE — Progress Notes (Addendum)
NEUROLOGY CONSULTATION PROGRESS NOTE   Date of service: Mar 29, 2023 Patient Name: Jose Hernandez MRN:  [id number] DOB:  [birth year]  Brief HPI  Jose Hernandez is a 32 y.o. male with PMH significant for prior SDH, seizures and noncompliant with keppra, EtOH use who was brought in to [facility] by EMS. He was found unresponsive on the side walk and concern that he had seizure.   He was brought in to the ED where EtOH levels were over 400. CT Head negative for ICH or any significant abnormality.   He had 2 seizures in the ED and was post ictal but able to briefly follow commands in the ED. He was loaded with Keppra and plan was to admit to hospital for observation overnight for seizure clustering. He was evaluated by hospitalist team and noted to be obtunded and discussed with me and transferred to [facility] for concern for potential subclinical seizure.   Interval Hx   He is awake and alert this morning, wanting to eat breakfast.  He feels somewhat lethargic and has a left-sided headache.  These are fairly consistent with what occurs after he has seizures.  He has not taken his Keppra for the past 5 months.  His last seizure was in February 2024 and he did not present to an emergency department or hospital for medical treatment.  He denies having difficulty affording his medicine since the start of the new year.  There are no side effects that prevent him from taking his medicine.  He has had a difficult last few months personally currently being on home and not having adequate social support.  He notes that when he does have seizures that they are described as his whole body tensing up and then he will begin to shake everywhere.  They are normally followed by confusion afterwards.  Vitals   Vitals:   03/29/23 0313 03/29/23 0314 03/29/23 0500 03/29/23 0738  BP: 107/78   (!) 105/92  Pulse: 73 66  72  Resp: 17 17  18   Temp: 97.9 F (36.6 C)   97.7 F (36.5 C)  TempSrc: Axillary   Oral   SpO2: 97% 98%  97%  Weight:   81.2 kg   Height:         Body mass index is 26.44 kg/m.  Physical Exam   General: Laying comfortably in bed; in no acute distress. HENT: Normal oropharynx and mucosa. Normal external appearance of ears and nose. CV: Regular rate and rhythm, no murmurs gallops or rubs . No peripheral edema.  Pulmonary: Symmetric Chest rise. Normal respiratory effort.  Abdomen: Soft to touch, non-tender.  Ext: No cyanosis, edema, or deformity  Skin: No rash.  Well-healed scar of his left forearm and right upper extremity Musculoskeletal: Normal digits and nails by inspection Mental Status: awake alert and oriented times person place and time and situation.  Following commands appropriately Cranial Nerves: II- pupils equal round reactive to light, no visual field deficits.   II/IV/VI- extraocular motion intact with right beating horizontal nystagmus as well as a beating vertical nystagmus  V/VII- no facial asymmetry, sensation normal  VIII- normal hearing to speech  IX/X- protecting his airway  XII-no tongue fasciculations or deficits Motor: Normal bulk and tone.  5 out of 5 strength in bilateral upper and lower extremities.  Finger-to-nose some what sluggish but normal.  No pronator drift appreciated. Sensory: Normal sensation to touch Cerebellar: Gait deferred  Labs   Basic Metabolic Panel:  Lab Results  Component Value Date   NA 141 03/29/2023   K 3.8 03/29/2023   CO2 27 03/29/2023   GLUCOSE 96 03/29/2023   BUN 8 03/29/2023   CREATININE 0.93 03/29/2023   CALCIUM 8.2 (L) 03/29/2023   GFRNONAA >60 03/29/2023   GFRAA >60 12/25/2017   HbA1c: No results found for: "HGBA1C" LDL:  Lab Results  Component Value Date   LDLCALC 49 02/20/2014   Urine Drug Screen:     Component Value Date/Time   LABOPIA NONE DETECTED 03/28/2023 1622   COCAINSCRNUR NONE DETECTED 03/28/2023 1622   LABBENZ NONE DETECTED 03/28/2023 1622   AMPHETMU NONE DETECTED 03/28/2023 1622    THCU NONE DETECTED 03/28/2023 1622   LABBARB NONE DETECTED 03/28/2023 1622    Alcohol Level     Component Value Date/Time   ETH 423 (HH) 03/28/2023 1637   Lab Results  Component Value Date   PHENYTOIN <2.5 (L) 07/19/2022   LEVETIRACETA 28.9 07/19/2022   Lab Results  Component Value Date   PHENYTOIN <2.5 (L) 07/19/2022   VALPROATE <10 (L) 07/19/2022    Imaging and Diagnostic studies  Results for orders placed during the hospital encounter of 03/28/23  CT Head Wo Contrast  Narrative CLINICAL DATA:  Found unconscious on sidewalk.  EXAM: CT HEAD WITHOUT CONTRAST  TECHNIQUE: Contiguous axial images were obtained from the base of the skull through the vertex without intravenous contrast.  RADIATION DOSE REDUCTION: This exam was performed according to the departmental dose-optimization program which includes automated exposure control, adjustment of the mA and/or kV according to patient size and/or use of iterative reconstruction technique.  COMPARISON:  CT head 02/16/2023  FINDINGS: Brain: No intracranial hemorrhage, mass effect, or evidence of acute infarct. No hydrocephalus. No extra-axial fluid collection.  Vascular: No hyperdense vessel or unexpected calcification. 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  Skull: No fracture or focal lesion.  Sinuses/Orbits: No acute finding.  Other: None.  IMPRESSION: 1. No acute intracranial abnormality.   Electronically Signed By: Minerva Fester M.D. On: 03/28/2023 17:10   Impression   Jose Hernandez is a 32 y.o. male with PMH significant for history of subdural hematoma, seizure disorder, alcohol use, unhomed. His neurologic examination is notable for some lethargy but overall awake and alert and neurologically intact.  History appears consistent with breakthrough seizures secondary to medication nonadherence.  CT imaging without acute intracranial abnormality, do not suspect injury or hemorrhage  event since found down.  He presented intoxicated so do not suspect alcohol withdrawal seizures.  He is able to afford his medications now that he has Medicaid but he has had a lot of recent life stressors that have led to his nonadherence.  We discussed the importance of adherence to his medications and following up with his outpatient physicians as needed.  He would like to talk with social work about being unhomed.  Will need to continue monitoring for any signs of alcohol withdrawal.  We also discussed [place] law of no driving after a seizure or seizure like activity for the next 6 months. He was agreeable and acknowledged this. He noted he has not driven in the last 6 months anyway.   Recommendations  -discontinue continuous eeg -continue keppra 500 mg BID -tylenol for continued headache -continue thiamine, folate, B12 multivitamin -social work follow-up for being unhomed -ongoing discussed in regards to substance cessation, denies marijuana use -discussion had in regards to no driving for 6 months after his seizure  Neurology will sign off and be  available for questions ______________________________________________________________________   Thank you for the opportunity to take part in the care of this patient. If you have any further questions, please contact the neurology consultation attending.  Signed,  Thalia Bloodgood DO  Internal Medicine Resident PGY-3 [place]  Pager: [phone]

## 2023-03-29 NOTE — Progress Notes (Signed)
LTM EEG running - no initial skin breakdown - push button tested - neuro notified. 

## 2023-03-29 NOTE — TOC CAGE-AID Note (Signed)
Transition of Care [facility]) - CAGE-AID Screening   Patient Details  Name: Jose Hernandez MRN: [id number] Date of Birth: 1991/06/26  Transition of [facility]) CM/SW Contact:    Kermit Balo, RN Phone Number: 03/29/2023, 2:24 PM   Clinical Narrative: Pt states he has cut back on his drinking. He was provided inpatient/ outpatient alcohol counseling resources.   CAGE-AID Screening:    Have You Ever Felt You Ought to Cut Down on Your Drinking or Drug Use?: Yes Have People Annoyed You By Critizing Your Drinking Or Drug Use?: No Have You Felt Bad Or Guilty About Your Drinking Or Drug Use?: Yes Have You Ever Had a Drink or Used Drugs First Thing In The Morning to Steady Your Nerves or to Get Rid of a Hangover?: No CAGE-AID Score: 2  Substance Abuse Education Offered: Yes  Substance abuse interventions: Patient Counseling, Transport planner

## 2023-03-30 ENCOUNTER — Other Ambulatory Visit (HOSPITAL_COMMUNITY): Payer: Self-pay

## 2023-03-30 DIAGNOSIS — G40901 Epilepsy, unspecified, not intractable, with status epilepticus: Secondary | ICD-10-CM | POA: Diagnosis not present

## 2023-03-30 LAB — COMPREHENSIVE METABOLIC PANEL
ALT: 49 U/L — ABNORMAL HIGH (ref 0–44)
AST: 40 U/L (ref 15–41)
Albumin: 3.5 g/dL (ref 3.5–5.0)
Alkaline Phosphatase: 47 U/L (ref 38–126)
Anion gap: 10 (ref 5–15)
BUN: 10 mg/dL (ref 6–20)
CO2: 26 mmol/L (ref 22–32)
Calcium: 9.3 mg/dL (ref 8.9–10.3)
Chloride: 101 mmol/L (ref 98–111)
Creatinine, Ser: 0.91 mg/dL (ref 0.61–1.24)
GFR, Estimated: >60 mL/min (ref >60)
Glucose, Bld: 102 mg/dL — ABNORMAL HIGH (ref 70–99)
Potassium: 3.7 mmol/L (ref 3.5–5.1)
Sodium: 137 mmol/L (ref 135–145)
Total Bilirubin: 0.4 mg/dL (ref 0.3–1.2)
Total Protein: 6.7 g/dL (ref 6.5–8.1)

## 2023-03-30 LAB — CBC
HCT: 33.5 % — ABNORMAL LOW (ref 39.0–52.0)
Hemoglobin: 11.3 g/dL — ABNORMAL LOW (ref 13.0–17.0)
MCH: 25.1 pg — ABNORMAL LOW (ref 26.0–34.0)
MCHC: 33.7 g/dL (ref 30.0–36.0)
MCV: 74.4 fL — ABNORMAL LOW (ref 80.0–100.0)
Platelets: 184 10*3/uL (ref 150–400)
RBC: 4.5 MIL/uL (ref 4.22–5.81)
RDW: 14.4 % (ref 11.5–15.5)
WBC: 6.6 10*3/uL (ref 4.0–10.5)
nRBC: 0 % (ref 0.0–0.2)

## 2023-03-30 LAB — MAGNESIUM: Magnesium: 1.5 mg/dL — ABNORMAL LOW (ref 1.7–2.4)

## 2023-03-30 LAB — PHOSPHORUS: Phosphorus: 4.3 mg/dL (ref 2.5–4.6)

## 2023-03-30 MED ORDER — BUTALBITAL-APAP-CAFFEINE 50-325-40 MG PO TABS
1.0000 | ORAL_TABLET | Freq: Four times a day (QID) | ORAL | Status: DC | PRN
  Administered 2023-03-30: 1 via ORAL
  Filled 2023-03-30: qty 1

## 2023-03-30 MED ORDER — MAGNESIUM SULFATE 4 GM/100ML IV SOLN
4.0000 g | Freq: Once | INTRAVENOUS | Status: AC
  Administered 2023-03-30: 4 g via INTRAVENOUS
  Filled 2023-03-30: qty 100

## 2023-03-30 MED ORDER — VITAMIN B-12 1000 MCG PO TABS
1000.0000 ug | ORAL_TABLET | Freq: Every day | ORAL | 0 refills | Status: AC
  Filled 2023-03-30: qty 30, 30d supply, fill #0

## 2023-03-30 MED ORDER — LEVETIRACETAM 500 MG PO TABS
500.0000 mg | ORAL_TABLET | Freq: Two times a day (BID) | ORAL | 2 refills | Status: AC
  Filled 2023-03-30: qty 60, 30d supply, fill #0

## 2023-03-30 NOTE — TOC Transition Note (Signed)
Transition of Care [facility]) - CM/SW Discharge Note   Patient Details  Name: Jose Hernandez MRN: [id number] Date of Birth: 1991-09-02  Transition of Care [facility]) CM/SW Contact:  Kermit Balo, RN Phone Number: 03/30/2023, 3:08 PM   Clinical Narrative:    Pt is discharging home with self care. Pt has transportation home.    Final next level of care: Home/Self Care Barriers to Discharge: No Barriers Identified   Patient Goals and CMS Choice      Discharge Placement                         Discharge Plan and Services Additional resources added to the After Visit Summary for     Discharge Planning Services: CM Consult                                 Social Determinants of Health (SDOH) Interventions SDOH Screenings   Food Insecurity: No Food Insecurity (03/29/2023)  Housing: Low Risk  (03/29/2023)  Transportation Needs: No Transportation Needs (03/29/2023)  Utilities: Not At Risk (03/29/2023)  Tobacco Use: High Risk (03/28/2023)     Readmission Risk Interventions     No data to display

## 2023-03-30 NOTE — Discharge Summary (Signed)
DISCHARGE SUMMARY  NEWMAN NAVRATIL  MR#: [id number]  DOB:04-Jan-1991  Date of Admission: 03/28/2023 Date of Discharge: 03/30/2023  Attending Physician:Lynzy Rawles Silvestre Gunner, MD  Patient's PCP:Pcp, No  Consults:  Disposition: D/C home   Follow-up Appts:  Follow-up Information     [facility] Follow up on 04/05/2023.   Why: Your appointment is at 1:15 pm. please arrive early and bring: picture ID, insurance information and current medications Contact information: [address] [place] [address] [phone]                Tests Needing Follow-up: -recheck B12 level and determine if oral replacement is sufficient for ongoing support  -Recheck magnesium level -Assess for abstinence from alcohol -Assess for compliance with AEDs -Follow-up CBC to ensure that anemia resolves with good nutrition and abstinence from alcohol  Discharge Diagnoses: Acute Seizure - seizure disorder - medication noncompliance B12 deficiency Sinus tachycardia Alcohol intoxication -alcohol abuse Microcytic anemia Hypomagnesemia  Initial presentation: 32 year old with a history of alcohol abuse, prior SDH, seizure disorder, and medical noncompliance who was brought to the ER by EMS 03/28/2023 after he was found unresponsive on the sidewalk. He admitted to the paramedics that he had been noncompliant with his seizure medications. He reportedly smelled heavily of alcohol. Upon arrival to the ER his vital signs were stable but he was hypothermic with a temperature of 90.4. Ethyl alcohol level was 423. UDS was negative. CT head was negative for acute findings. While in the ER the patient suffered 2 seizures. He was loaded with Keppra x 2 as well as Vimpat.   Hospital Course:  Seizure -seizure disorder - medication noncompliance Care per Neurology team - rapidly improved clinically -admitted to noncompliance with antiepileptic drugs -no ongoing seizure activity  on long-term EEG - to continue Keppra w/o change after d/c - educated on absolute need to comply w/ Keppra    B12 deficiency B12 markedly depressed at 177 - supplemented with subcutaneous load during admission then transitioned to oral supplementation at discharge with need for follow-up level as outpt    Sinus tachycardia Patient appears clinically stable during these episodes -perhaps simply related to dehydration and clinical situation -monitored closely -short trial of clonidine utilized in setting of possible early alcohol withdrawal - resolved prior to d/c with volume resuscitation    Alcohol intoxication -alcohol abuse Ethanol level 423 on arrival - no frank withdrawal during admit - counseled to abstain from EtOH    Microcytic anemia Iron and TIBC are actually normal/high normal -this may simply be due to the toxic effects of high-dose ongoing alcohol on the bone marrow   Possible suicidal ideation  Patient reportedly informed the ER physician at time of his presentation that he attempted to hang himself 3 days prior and also considered walking out in front of a car - notes suggest he was able to contract for safety at that time - at time of d/c he does not remember that conversation at all, and denies active SI - he admits to occasional depression, but states he has loved ones he confides in, and has participated in counseling in the past - no evidence pt represents a danger to himself - he contracts for safety and has social support available readily   Hypomagnesemia Likely due to alcohol abuse and poor nutritional state -replaced with IV load prior to discharge -encourage abstinence from alcohol and consistent nutritional intake which should resolve this problem -outpatient follow-up suggested  Allergies  as of 03/30/2023   No Known Allergies      Medication List     TAKE these medications    cyanocobalamin 1000 MCG tablet Commonly known as: VITAMIN B12 Take 1 tablet (1,000  mcg total) by mouth daily.   levETIRAcetam 500 MG tablet Commonly known as: KEPPRA Take 1 tablet (500 mg total) by mouth 2 (two) times daily.        Day of Discharge BP (!) 140/84 (BP Location: Right Arm)   Pulse 87   Temp 99 F (37.2 C) (Oral)   Resp 18   Ht 5\' 9"  (1.753 m)   Wt 81.2 kg   SpO2 97%   BMI 26.44 kg/m   Physical Exam: General: No acute respiratory distress Lungs: Clear to auscultation bilaterally without wheezes or crackles Cardiovascular: Regular rate and rhythm without murmur Abdomen: Nontender, nondistended, soft, bowel sounds positive, no rebound Extremities: No significant cyanosis, clubbing, or edema bilateral lower extremities  Basic Metabolic Panel: Recent Labs  Lab 03/28/23 1637 03/29/23 0253 03/30/23 0455  NA 140 141 137  K 3.6 3.8 3.7  CL 107 107 101  CO2 23 27 26   GLUCOSE 112* 96 102*  BUN 9 8 10   CREATININE 0.94 0.93 0.91  CALCIUM 8.1* 8.2* 9.3  MG  --  1.8 1.5*  PHOS  --   --  4.3    CBC: Recent Labs  Lab 03/28/23 1637 03/29/23 0253 03/30/23 0455  WBC 7.4 7.8 6.6  NEUTROABS 4.9 4.7  --   HGB 11.5* 11.1* 11.3*  HCT 35.7* 33.7* 33.5*  MCV 75.5* 75.4* 74.4*  PLT 239 207 184    Time spent in discharge (includes decision making & examination of pt): 35 minutes  03/30/2023, 2:15 PM   Lonia Blood, MD [facility] Office  [phone]

## 2023-03-30 NOTE — Progress Notes (Signed)
Discharge instructions reviewed with patient.  All questions answered.  Cleotilde Spadaccini, RN  

## 2023-03-30 NOTE — Discharge Instructions (Signed)
Seizure precautions: Per [place] DMV statutes, patients with seizures are not allowed to drive until they have been seizure-free for six months and cleared by a physician    Use caution when using heavy equipment or power tools. Avoid working on ladders or at heights. Take showers instead of baths. Ensure the water temperature is not too high on the home water heater. Do not go swimming alone. Do not lock yourself in a room alone (i.e. bathroom). When caring for infants or small children, sit down when holding, feeding, or changing them to minimize risk of injury to the child in the event you have a seizure. Maintain good sleep hygiene. Avoid alcohol.    If patient has another seizure, call 911 and bring them back to the ED if: A.  The seizure lasts longer than 5 minutes.      B.  The patient doesn't wake shortly after the seizure or has new problems such as difficulty seeing, speaking or moving following the seizure C.  The patient was injured during the seizure D.  The patient has a temperature over 102 F (39C) E.  The patient vomited during the seizure and now is having trouble breathing    During the Seizure   - First, ensure adequate ventilation and place patients on the floor on their left side  Loosen clothing around the neck and ensure the airway is patent. If the patient is clenching the teeth, do not force the mouth open with any object as this can cause severe damage - Remove all items from the surrounding that can be hazardous. The patient may be oblivious to what's happening and may not even know what he or she is doing. If the patient is confused and wandering, either gently guide him/her away and block access to outside areas - Reassure the individual and be comforting - Call 911. In most cases, the seizure ends before EMS arrives. However, there are cases when seizures may last over 3 to 5 minutes. Or the individual may have developed breathing difficulties or severe  injuries. If a pregnant patient or a person with diabetes develops a seizure, it is prudent to call an ambulance. - Finally, if the patient does not regain full consciousness, then call EMS. Most patients will remain confused for about 45 to 90 minutes after a seizure, so you must use judgment in calling for help.      After the Seizure (Postictal Stage)   After a seizure, most patients experience confusion, fatigue, muscle pain and/or a headache. Thus, one should permit the individual to sleep. For the next few days, reassurance is essential. Being calm and helping reorient the person is also of importance.   Most seizures are painless and end spontaneously. Seizures are not harmful to others but can lead to complications such as stress on the lungs, brain and the heart. Individuals with prior lung problems may develop labored breathing and respiratory distress.  

## 2023-04-05 ENCOUNTER — Ambulatory Visit: Payer: Medicaid Other | Admitting: Student

## 2023-04-05 NOTE — Progress Notes (Deleted)
   CC: ***  HPI:  [name] is a 32 y.o. male with past medical history of seizure disorder, alcohol use who presents to the clinic to establish care. Patient was recently hospitalized from 03/28/2023-03/30/2023 for seizures.  Patient was found unresponsive, found to be in status epilepticus.  Patient was loaded with Keppra.  He has been noncompliant with seizure medications in the past.  Patient had Vimpat as well in the hospital.  Patient was discharged on Keppra.  Patient decreased B12 177.  Recent labs: 03/29/1023: Vitamin B12 177 05/10/724: CMP showed elevated ALT of 49 otherwise within normal limits. CBC: Hemoglobin 11.3, MCV 74.4, platelets 184 Mag 1.5 Iron panel: On 62 iron, TIBC 326, iron ratio is 50    History: Medical: Medications: Keppra 500 mg twice daily, vitamin B12 1000 mcg daily Surgical: Family: Allergies: Social:  Past Medical History:  Diagnosis Date   Abscess    Anemia    History of epistaxis    years ago   Subdural hemorrhage (HCC) 07/19/2022     Current Outpatient Medications:    cyanocobalamin (VITAMIN B12) 1000 MCG tablet, Take 1 tablet (1,000 mcg total) by mouth daily., Disp: 30 tablet, Rfl: 0   levETIRAcetam (KEPPRA) 500 MG tablet, Take 1 tablet (500 mg total) by mouth 2 (two) times daily., Disp: 60 tablet, Rfl: 2  Review of Systems:  ***  Constitutional: Eye: Respiratory: Cardiovascular: GI: MSK: GU: Skin: Neuro: Endocrine:   Physical Exam:  There were no vitals filed for this visit. *** General: Patient is sitting comfortably in the room  Eyes: Pupils equal and reactive to light, EOM intact  Head: Normocephalic, atraumatic  Neck: Supple, nontender, full range of motion, No JVD Cardio: Regular rate and rhythm, no murmurs, rubs or gallops. 2+ pulses to bilateral upper and lower extremities  Chest: No chest tenderness Pulmonary: Clear to ausculation bilaterally with no rales, rhonchi, and crackles  Abdomen: Soft,  nontender with normoactive bowel sounds with no rebound or guarding  Neuro: Alert and orientated x3. CN II-XII intact. Sensation intact to upper and lower extremities. 2+ patellar reflex.  Back: No midline tenderness, no step off or deformities noted. No paraspinal muscle tenderness.  Skin: No rashes noted  MSK: 5/5 strength to upper and lower extremities.    Assessment & Plan:   No problem-specific Assessment & Plan notes found for this encounter.    Patient {GC/GE:3044014::"discussed with","seen with"} Dr. {NAMES:[id number]::"Guilloud","Hoffman","Mullen","Narendra","Williams","Vincent"}  Modena Slater, DO PGY-1 Internal Medicine Resident  Pager: [phone]

## 2023-04-12 ENCOUNTER — Ambulatory Visit: Payer: Medicaid Other | Admitting: Student

## 2023-04-12 NOTE — Progress Notes (Deleted)
   CC: ***  HPI:  [name] is a 32 y.o. with a history of alcohol abuse, prior SDH, seizure disorder, and medical noncompliance to medication.  Patient current medication regimen includes  Seizure disorder: Keppra 500 mg twice daily Vitamin B12 deficiency: Vitamin B-12 1000 mcg daily  Patient recently hospitalized from 05/08-05/10 after having a breakthrough seizure likely secondary to medication noncompliance.  Patient was also intoxicated on arrival.  During hospital stay patient decreased magnesium.  Patient was also evaluated by Neurology who recommended continuing Keppra.  Patient had imaging which was negative.  Most recent labs: 03/30/2023: CMP showing sodium 137, potassium 3.7, glucose 102, creatinine 0.91, ALT 49 CBC showing hemoglobin 11.3, MCV 74.4, platelets 184 Magnesium: 1.5  Repeat mag and CBC today.  Can also obtain iron panel. Past Medical History:  Diagnosis Date   Abscess    Anemia    History of epistaxis    years ago   Subdural hemorrhage (HCC) 07/19/2022     Current Outpatient Medications:    cyanocobalamin (VITAMIN B12) 1000 MCG tablet, Take 1 tablet (1,000 mcg total) by mouth daily., Disp: 30 tablet, Rfl: 0   levETIRAcetam (KEPPRA) 500 MG tablet, Take 1 tablet (500 mg total) by mouth 2 (two) times daily., Disp: 60 tablet, Rfl: 2  Review of Systems:  ***  Constitutional: Eye: Respiratory: Cardiovascular: GI: MSK: GU: Skin: Neuro: Endocrine:   Physical Exam:  There were no vitals filed for this visit. *** General: Patient is sitting comfortably in the room  Eyes: Pupils equal and reactive to light, EOM intact  Head: Normocephalic, atraumatic  Neck: Supple, nontender, full range of motion, No JVD Cardio: Regular rate and rhythm, no murmurs, rubs or gallops. 2+ pulses to bilateral upper and lower extremities  Chest: No chest tenderness Pulmonary: Clear to ausculation bilaterally with no rales, rhonchi, and crackles  Abdomen:  Soft, nontender with normoactive bowel sounds with no rebound or guarding  Neuro: Alert and orientated x3. CN II-XII intact. Sensation intact to upper and lower extremities. 2+ patellar reflex.  Back: No midline tenderness, no step off or deformities noted. No paraspinal muscle tenderness.  Skin: No rashes noted  MSK: 5/5 strength to upper and lower extremities.    Assessment & Plan:   No problem-specific Assessment & Plan notes found for this encounter.    Patient {GC/GE:3044014::"discussed with","seen with"} Dr. {NAMES:[id number]::"Guilloud","Hoffman","Mullen","Narendra","Williams","Vincent"}  Modena Slater, DO PGY-1 Internal Medicine Resident  Pager: [phone]

## 2024-05-16 ENCOUNTER — Encounter (HOSPITAL_COMMUNITY): Payer: Self-pay | Admitting: Interventional Radiology
